# Patient Record
Sex: Female | Born: 1970 | Race: Black or African American | Hispanic: No | Marital: Married | Smoking: Never smoker
Health system: Southern US, Community
[De-identification: ages and names within clinical notes are randomized; demographics above are authoritative.]

## PROBLEM LIST (undated history)

## (undated) DIAGNOSIS — G43909 Migraine, unspecified, not intractable, without status migrainosus: Secondary | ICD-10-CM

## (undated) DIAGNOSIS — K921 Melena: Secondary | ICD-10-CM

## (undated) DIAGNOSIS — E079 Disorder of thyroid, unspecified: Secondary | ICD-10-CM

## (undated) DIAGNOSIS — E538 Deficiency of other specified B group vitamins: Secondary | ICD-10-CM

## (undated) DIAGNOSIS — N390 Urinary tract infection, site not specified: Secondary | ICD-10-CM

## (undated) DIAGNOSIS — J45909 Unspecified asthma, uncomplicated: Secondary | ICD-10-CM

## (undated) DIAGNOSIS — T7840XA Allergy, unspecified, initial encounter: Secondary | ICD-10-CM

## (undated) HISTORY — DX: Urinary tract infection, site not specified: N39.0

## (undated) HISTORY — PX: COLONOSCOPY: SHX174

## (undated) HISTORY — DX: Melena: K92.1

## (undated) HISTORY — DX: Disorder of thyroid, unspecified: E07.9

## (undated) HISTORY — DX: Deficiency of other specified B group vitamins: E53.8

## (undated) HISTORY — DX: Unspecified asthma, uncomplicated: J45.909

## (undated) HISTORY — DX: Allergy, unspecified, initial encounter: T78.40XA

## (undated) HISTORY — DX: Migraine, unspecified, not intractable, without status migrainosus: G43.909

---

## 2018-05-11 DIAGNOSIS — J45909 Unspecified asthma, uncomplicated: Secondary | ICD-10-CM | POA: Insufficient documentation

## 2018-05-11 DIAGNOSIS — Z6837 Body mass index (BMI) 37.0-37.9, adult: Secondary | ICD-10-CM | POA: Diagnosis not present

## 2018-05-11 DIAGNOSIS — D259 Leiomyoma of uterus, unspecified: Secondary | ICD-10-CM | POA: Insufficient documentation

## 2018-05-11 DIAGNOSIS — R399 Unspecified symptoms and signs involving the genitourinary system: Secondary | ICD-10-CM | POA: Diagnosis not present

## 2018-05-11 DIAGNOSIS — E049 Nontoxic goiter, unspecified: Secondary | ICD-10-CM | POA: Insufficient documentation

## 2018-05-11 DIAGNOSIS — Z01411 Encounter for gynecological examination (general) (routine) with abnormal findings: Secondary | ICD-10-CM | POA: Diagnosis not present

## 2018-05-11 DIAGNOSIS — N926 Irregular menstruation, unspecified: Secondary | ICD-10-CM | POA: Diagnosis not present

## 2018-06-04 ENCOUNTER — Encounter: Payer: Self-pay | Admitting: Family Medicine

## 2018-06-04 ENCOUNTER — Ambulatory Visit: Payer: 59 | Admitting: Family Medicine

## 2018-06-04 VITALS — BP 98/78 | HR 78 | Temp 97.9°F | Ht 64.0 in | Wt 210.0 lb

## 2018-06-04 DIAGNOSIS — J4520 Mild intermittent asthma, uncomplicated: Secondary | ICD-10-CM

## 2018-06-04 DIAGNOSIS — M654 Radial styloid tenosynovitis [de Quervain]: Secondary | ICD-10-CM | POA: Diagnosis not present

## 2018-06-04 DIAGNOSIS — Z8669 Personal history of other diseases of the nervous system and sense organs: Secondary | ICD-10-CM

## 2018-06-04 DIAGNOSIS — Z23 Encounter for immunization: Secondary | ICD-10-CM | POA: Diagnosis not present

## 2018-06-04 DIAGNOSIS — E049 Nontoxic goiter, unspecified: Secondary | ICD-10-CM

## 2018-06-04 DIAGNOSIS — Z298 Encounter for other specified prophylactic measures: Secondary | ICD-10-CM

## 2018-06-04 DIAGNOSIS — Z7184 Encounter for health counseling related to travel: Secondary | ICD-10-CM | POA: Diagnosis not present

## 2018-06-04 MED ORDER — DOXYCYCLINE HYCLATE 100 MG PO TABS: ORAL_TABLET | ORAL | 0 refills | Status: DC

## 2018-06-04 MED ORDER — SCOPOLAMINE 1 MG/3DAYS TD PT72: 1.0000 | MEDICATED_PATCH | TRANSDERMAL | 0 refills | Status: DC

## 2018-06-04 NOTE — Progress Notes (Signed)
Patient presents to clinic today to f/u and establish care.  Pt's last name is pronounced "Rah-bey"  SUBJECTIVE: PMH:  Pt is a 47 yo female with pmh sig for asthma, h/o migraines, goiter, varicose veins (s/p txt 2018, 2019) pt was previously seen in [place], [place] by [facility].  Last physical 12/2017.  Asthma: -pt notes brief period when she used an albuterol inhaler for wheezing yrs ago -no recent symptoms -has an inhaler just in case  H/o migraines: -last HA was yrs ago  Goiter: -followed by Endocrinology in [place]. -endorses several biopsies which were negative -had FNA last yr.  Was getting q 3 yrs -last TSH May 2019 was elevated compared to prior studies. -pt denies dysphagia, constipation, diarrhea, hair loss, palpitations  Upcoming Travel: -in 2 wks pt will travel to the [place] x 8 days -pt endorses previous international travel.  Yellow fever, tetanus, and thyphoid up to date. -childhood vaccines up to date.   Allergies: NKDA  Social Hx: Pt is married.  Patient works as a Optometrist.  Patient attended [employer].  Patient denies alcohol, tobacco, and drug use.  Health Maintenance: [facility]N-[facility] Immunizations --tetanus 2018, influenza 2019, TB test 2018, shingles vaccine 2014 PAP --2018  Past Medical History:  Diagnosis Date  . Asthma   . Blood in stool   . Migraine   . Thyroid disease   . UTI (urinary tract infection)     History reviewed. No pertinent surgical history.  No current outpatient medications on file prior to visit.   No current facility-administered medications on file prior to visit.     No Known Allergies  Family History  Problem Relation Age of Onset  . Cancer Mother   . Diabetes Mother   . Depression Father   . Diabetes Father   . Arthritis Sister   . Depression Sister   . Arthritis Maternal Grandmother   . Diabetes Maternal Grandmother   . COPD Maternal Grandfather   . Early death Maternal Grandfather   . Diabetes  Paternal Grandmother   . Depression Paternal Grandmother   . Arthritis Paternal Grandmother     Social History   Socioeconomic History  . Marital status: Unknown    Spouse name: Not on file  . Number of children: Not on file  . Years of education: Not on file  . Highest education level: Not on file  Occupational History  . Not on file  Social Needs  . Financial resource strain: Not on file  . Food insecurity:    Worry: Not on file    Inability: Not on file  . Transportation needs:    Medical: Not on file    Non-medical: Not on file  Tobacco Use  . Smoking status: Never Smoker  . Smokeless tobacco: Never Used  Substance and Sexual Activity  . Alcohol use: Yes  . Drug use: Never  . Sexual activity: Yes  Lifestyle  . Physical activity:    Days per week: Not on file    Minutes per session: Not on file  . Stress: Not on file  Relationships  . Social connections:    Talks on phone: Not on file    Gets together: Not on file    Attends religious service: Not on file    Active member of club or organization: Not on file    Attends meetings of clubs or organizations: Not on file    Relationship status: Not on file  . Intimate partner violence:  Fear of current or ex partner: Not on file    Emotionally abused: Not on file    Physically abused: Not on file    Forced sexual activity: Not on file  Other Topics Concern  . Not on file  Social History Narrative  . Not on file    ROS  BP 98/78 (BP Location: Left Arm, Patient Position: Sitting, Cuff Size: Large)   Pulse 78   Temp 97.9 F (36.6 C) (Oral)   Ht 5\' 4"  (1.626 m)   Wt 210 lb (95.3 kg)   LMP 06/01/2018 (Exact Date)   SpO2 98%   BMI 36.05 kg/m   Physical Exam Gen. Pleasant, well developed, well-nourished, in NAD HEENT - [place]/AT, PERRL, EOMI, conjunctive clear, no scleral icterus, no nasal drainage, pharynx without erythema or exudate. Neck: moderate thyromegaly R lobe >L, no carotid bruits Lungs: no use of  accessory muscles, CTAB, no wheezes, rales or rhonchi Cardiovascular: RRR, no r/g/m, no peripheral edema Abdomen: BS present, soft, nontender,nondistended Musculoskeletal: R wrist with edema at proximal anatomical snuff box. Mild discomfort with movement of R thumb.  Moves all four extremities, no cyanosis or clubbing, normal tone Neuro:  A&Ox3, CN II-XII intact, normal gait Skin:  Warm, dry, intact, no lesions  No results found for this or any previous visit (from the past 2160 hour(s)).  Assessment/Plan: De Quervain's tenosynovitis -Discussed various treatment options including steroids.  Patient declines at this time -Given handout -We will try thumb spica splint, turmeric, biofreeze, NSAIDs -consider PT for continued symptoms  Travel advice encounter -Patient given handout on travel to the [place] -Also given info from [employer] website - Plan: scopolamine (TRANSDERM-SCOP) 1 MG/3DAYS, doxycycline (VIBRA-TABS) 100 MG tablet  Mild intermittent asthma, unspecified whether complicated  History of migraine  Goiter -We will continue to monitor. -We will need repeat ultrasound in May 2020. -We will also obtain TSH at that time. -Consider referral to endocrinology  Need for malaria prophylaxis  - Plan: doxycycline (VIBRA-TABS) 100 MG tablet  Need for immunization against influenza  - Plan: Flu Vaccine QUAD 36+ mos IM  Follow-up PRN  Grier Mitts, MD

## 2018-06-04 NOTE — Patient Instructions (Signed)
De Quervain Tenosynovitis Tendons attach muscles to bones. They also help with joint movements. When tendons become irritated or swollen, it is called tendinitis. The extensor pollicis brevis (EPB) tendon connects the EPB muscle to a bone that is near the base of the thumb. The EPB muscle helps to straighten and extend the thumb. De Quervain tenosynovitis is a condition in which the EPB tendon lining (sheath) becomes irritated, thickened, and swollen. This condition is sometimes called stenosing tenosynovitis. This condition causes pain on the thumb side of the back of the wrist. What are the causes? Causes of this condition include:  Activities that repeatedly cause your thumb and wrist to extend.  A sudden increase in activity or change in activity that affects your wrist.  What increases the risk? This condition is more likely to develop in:  Females.  People who have diabetes.  Women who have recently given birth.  People who are over 98 years of age.  People who do activities that involve repeated hand and wrist motions, such as tennis, racquetball, volleyball, gardening, and taking care of children.  People who do heavy labor.  People who have poor wrist strength and flexibility.  People who do not warm up properly before activities.  What are the signs or symptoms? Symptoms of this condition include:  Pain or tenderness over the thumb side of the back of the wrist when your thumb and wrist are not moving.  Pain that gets worse when you straighten your thumb or extend your thumb or wrist.  Pain when the injured area is touched.  Locking or catching of the thumb joint while you bend and straighten your thumb.  Decreased thumb motion due to pain.  Swelling over the affected area.  How is this diagnosed? This condition is diagnosed with a medical history and physical exam. Your health care provider will ask for details about your injury and ask about your  symptoms. How is this treated? Treatment may include the use of icing and medicines to reduce pain and swelling. You may also be advised to wear a splint or brace to limit your thumb and wrist motion. In less severe cases, treatment may also include working with a physical therapist to strengthen your wrist and calm the irritation around your EPB tendon sheath. In severe cases, surgery may be needed. Follow these instructions at home: If you have a splint or brace:  Wear it as told by your health care provider. Remove it only as told by your health care provider.  Loosen the splint or brace if your fingers become numb and tingle, or if they turn cold and blue.  Keep the splint or brace clean and dry. Managing pain, stiffness, and swelling  If directed, apply ice to the injured area. ? Put ice in a plastic bag. ? Place a towel between your skin and the bag. ? Leave the ice on for 20 minutes, 2-3 times per day.  Move your fingers often to avoid stiffness and to lessen swelling.  Raise (elevate) the injured area above the level of your heart while you are sitting or lying down. General instructions  Return to your normal activities as told by your health care provider. Ask your health care provider what activities are safe for you.  Take over-the-counter and prescription medicines only as told by your health care provider.  Keep all follow-up visits as told by your health care provider. This is important.  Do not drive or operate heavy machinery while taking  prescription pain medicine. Contact a health care provider if:  Your pain, tenderness, or swelling gets worse, even if you have had treatment.  You have numbness or tingling in your wrist, hand, or fingers on the injured side. This information is not intended to replace advice given to you by your health care provider. Make sure you discuss any questions you have with your health care provider. Document Released: 08/19/2005  Document Revised: 01/25/2016 Document Reviewed: 10/25/2014 Elsevier Interactive Patient Education  2018 [place] A goiter is an enlarged thyroid gland. The thyroid gland is located in the lower front of the neck. The gland produces hormones that regulate mood, body temperature, pulse rate, and digestion. Most goiters are painless and are not a cause for serious concern. Goiters and conditions that cause goiters can be treated, if necessary. What are the causes? Causes of this condition include:  Diseases that attack healthy cells in your body (autoimmune diseases) and affect your thyroid function, such as: ? Graves disease. This causes too much thyroid hormone to be produced and it makes your thyroid overly active (hyperthyroidism). ? Hashimoto disease. This type of inflammation of the thyroid (thyroiditis) causes too little thyroid hormone to be produced and it makes your thyroid not active enough (hypothyroidism).  Other conditions that cause thyroiditis.  Nodular goiter. This means that there are one or more small growths on your thyroid. These can create too much thyroid hormone.  Pregnancy.  Thyroid cancer. This is rare.  Certain medicines.  Radiation exposure.  Iodine deficiency.  In some cases, the cause may not be known (idiopathic). What increases the risk? This condition is more likely to develop in:  People who have a family history of goiter.  Women.  People who do not get enough iodine in their diet.  People who are older than 12.  People who smoke tobacco.  What are the signs or symptoms? Common symptoms of this condition include:  Swelling in the lower part of the neck. This swelling can range from a very small bump to a large lump.  A tight feeling in the throat.  A hoarse voice.  Other symptoms include:  Coughing.  Wheezing.  Difficulty swallowing.  Difficulty breathing.  Bulging neck veins.  Dizziness.  In some cases,  there are no symptoms and thyroid hormone levels may be normal. When a goiter is the result of hyperthyroidism, symptoms may also include:  Nervousness or restlessness.  Inability to tolerate heat.  Unexplained weight loss.  Diarrhea.  Change in the texture of hair or skin.  Changes in heart beat, such as skipped beats, extra beats, or a rapid heart rate.  Loss of menstruation.  Shaky hands.  Increased appetite.  Sleep problems.  When a goiter is the result of hypothyroidism, symptoms may also include:  Feeling like you have no energy (lethargy).  Inability to tolerate cold.  Weight gain that is not explained by a change in diet or exercise habits.  Dry skin.  Coarse hair.  Menstrual irregularity.  Constipation.  Sadness or depression.  How is this diagnosed? This condition may be diagnosed with a medical history and physical exam. You may also have other tests, including:  Blood tests to check thyroid function.  Imaging tests, such as: ? Ultrasonography. ? CT scan. ? MRI. ? Thyroid scan. You will be given a safe radioactive injection, then images will be taken of your thyroid.  Tissue sample (biopsy) of the goiter or any nodules. This checks to see  if the goiter or nodules are cancerous.  How is this treated? Treatment for this condition depends on the cause. Treatment may include:  Medicines to control your thyroid.  Anti-inflammatory or steroid medicines, if inflammation is the cause.  Iodine supplements or changes in diet, if the goiter is caused by iodine deficiency.  Radiation therapy.  Surgery to remove your thyroid.  In some cases, no treatment is necessary, and your health care provider will monitor your condition at regular checkups. Follow these instructions at home:  Follow recommendations from your health care provider for any changes to your diet.  Take over-the-counter and prescription medicines only as told by your health care  provider.  Do not use any tobacco products, including cigarettes, chewing tobacco, or e-cigarettes. If you need help quitting, ask your health care provider.  Keep all follow-up appointments as told by your health care provider. This is important. Contact a health care provider if:  Your symptoms do not get better with treatment. Get help right away if:  You develop sudden, unexplained confusion or other mental changes.  You have nausea, vomiting, or diarrhea.  You develop a fever.  Your skin or the whites of your eyes appear yellow (jaundice).  You develop chest pain.  You have trouble breathing or swallowing.  You suddenly become very weak.  You experience extreme restlessness. This information is not intended to replace advice given to you by your health care provider. Make sure you discuss any questions you have with your health care provider. Document Released: 02/06/2010 Document Revised: 03/08/2016 Document Reviewed: 08/15/2014 Elsevier Interactive Patient Education  [employer].

## 2018-06-12 ENCOUNTER — Encounter: Payer: Self-pay | Admitting: Internal Medicine

## 2018-10-02 ENCOUNTER — Telehealth: Payer: Self-pay | Admitting: Family Medicine

## 2018-10-02 DIAGNOSIS — Z0279 Encounter for issue of other medical certificate: Secondary | ICD-10-CM

## 2018-10-02 NOTE — Telephone Encounter (Signed)
Form has not been received yet.

## 2018-10-02 NOTE — Telephone Encounter (Signed)
Pt  Dropped off a [employer] Physician's Release Form for Physical Activity to be completed by the provider.  Upon completion pt would liked to be called to pick the form up at [phone].  Form was placed in providers folder for completion.

## 2018-10-06 NOTE — Telephone Encounter (Signed)
Form has been received and placed on dr Volanda Napoleon folder for review

## 2018-10-13 NOTE — Telephone Encounter (Signed)
Called pt left a message to pick up her form at the office

## 2018-10-13 NOTE — Telephone Encounter (Signed)
Form placed in the drawer at the front desk

## 2018-10-14 NOTE — Telephone Encounter (Signed)
Patient picked up form and paid the form fee $29  I made a copy to go to scan

## 2019-06-08 ENCOUNTER — Other Ambulatory Visit: Payer: Self-pay

## 2019-06-08 ENCOUNTER — Ambulatory Visit (INDEPENDENT_AMBULATORY_CARE_PROVIDER_SITE_OTHER): Payer: 59

## 2019-06-08 DIAGNOSIS — Z23 Encounter for immunization: Secondary | ICD-10-CM

## 2019-06-10 ENCOUNTER — Other Ambulatory Visit: Payer: Self-pay

## 2019-06-10 ENCOUNTER — Encounter: Payer: Self-pay | Admitting: Family Medicine

## 2019-06-10 ENCOUNTER — Ambulatory Visit (INDEPENDENT_AMBULATORY_CARE_PROVIDER_SITE_OTHER): Payer: 59 | Admitting: Family Medicine

## 2019-06-10 VITALS — BP 96/78 | HR 58 | Temp 97.8°F | Wt 178.0 lb

## 2019-06-10 DIAGNOSIS — Z0000 Encounter for general adult medical examination without abnormal findings: Secondary | ICD-10-CM

## 2019-06-10 DIAGNOSIS — M5432 Sciatica, left side: Secondary | ICD-10-CM

## 2019-06-10 DIAGNOSIS — Z13220 Encounter for screening for lipoid disorders: Secondary | ICD-10-CM

## 2019-06-10 DIAGNOSIS — Z131 Encounter for screening for diabetes mellitus: Secondary | ICD-10-CM | POA: Diagnosis not present

## 2019-06-10 DIAGNOSIS — E049 Nontoxic goiter, unspecified: Secondary | ICD-10-CM

## 2019-06-10 LAB — BASIC METABOLIC PANEL
BUN: 10 mg/dL (ref 6–23)
CO2: 28 mEq/L (ref 19–32)
Calcium: 9.6 mg/dL (ref 8.4–10.5)
Chloride: 104 mEq/L (ref 96–112)
Creatinine, Ser: 0.78 mg/dL (ref 0.40–1.20)
GFR: 96.18 mL/min (ref >60.00)
Glucose, Bld: 78 mg/dL (ref 70–99)
Potassium: 4 mEq/L (ref 3.5–5.1)
Sodium: 139 mEq/L (ref 135–145)

## 2019-06-10 LAB — T4, FREE: Free T4: 0.87 ng/dL (ref 0.60–1.60)

## 2019-06-10 LAB — LIPID PANEL
Cholesterol: 165 mg/dL (ref 0–200)
HDL: 43.5 mg/dL (ref >39.00)
LDL Cholesterol: 111 mg/dL — ABNORMAL HIGH (ref 0–99)
NonHDL: 121.1
Total CHOL/HDL Ratio: 4
Triglycerides: 49 mg/dL (ref 0.0–149.0)
VLDL: 9.8 mg/dL (ref 0.0–40.0)

## 2019-06-10 LAB — HEMOGLOBIN A1C: Hgb A1c MFr Bld: 5.5 % (ref 4.6–6.5)

## 2019-06-10 LAB — TSH: TSH: 0.4 u[IU]/mL (ref 0.35–4.50)

## 2019-06-10 NOTE — Patient Instructions (Addendum)
Preventive Care 40-46 Years Old, Female Preventive care refers to visits with your health care provider and lifestyle choices that can promote health and wellness. This includes:  A yearly physical exam. This may also be called an annual well check.  Regular dental visits and eye exams.  Immunizations.  Screening for certain conditions.  Healthy lifestyle choices, such as eating a healthy diet, getting regular exercise, not using drugs or products that contain nicotine and tobacco, and limiting alcohol use. What can I expect for my preventive care visit? Physical exam Your health care provider will check your:  Height and weight. This may be used to calculate body mass index (BMI), which tells if you are at a healthy weight.  Heart rate and blood pressure.  Skin for abnormal spots. Counseling Your health care provider may ask you questions about your:  Alcohol, tobacco, and drug use.  Emotional well-being.  Home and relationship well-being.  Sexual activity.  Eating habits.  Work and work environment.  Method of birth control.  Menstrual cycle.  Pregnancy history. What immunizations do I need?  Influenza (flu) vaccine  This is recommended every year. Tetanus, diphtheria, and pertussis (Tdap) vaccine  You may need a Td booster every 10 years. Varicella (chickenpox) vaccine  You may need this if you have not been vaccinated. Zoster (shingles) vaccine  You may need this after age 60. Measles, mumps, and rubella (MMR) vaccine  You may need at least one dose of MMR if you were born in 1957 or later. You may also need a second dose. Pneumococcal conjugate (PCV13) vaccine  You may need this if you have certain conditions and were not previously vaccinated. Pneumococcal polysaccharide (PPSV23) vaccine  You may need one or two doses if you smoke cigarettes or if you have certain conditions. Meningococcal conjugate (MenACWY) vaccine  You may need this if you  have certain conditions. Hepatitis A vaccine  You may need this if you have certain conditions or if you travel or work in places where you may be exposed to hepatitis A. Hepatitis B vaccine  You may need this if you have certain conditions or if you travel or work in places where you may be exposed to hepatitis B. Haemophilus influenzae type b (Hib) vaccine  You may need this if you have certain conditions. Human papillomavirus (HPV) vaccine  If recommended by your health care provider, you may need three doses over 6 months. You may receive vaccines as individual doses or as more than one vaccine together in one shot (combination vaccines). Talk with your health care provider about the risks and benefits of combination vaccines. What tests do I need? Blood tests  Lipid and cholesterol levels. These may be checked every 5 years, or more frequently if you are over 50 years old.  Hepatitis C test.  Hepatitis B test. Screening  Lung cancer screening. You may have this screening every year starting at age 55 if you have a 30-pack-year history of smoking and currently smoke or have quit within the past 15 years.  Colorectal cancer screening. All adults should have this screening starting at age 50 and continuing until age 75. Your health care provider may recommend screening at age 45 if you are at increased risk. You will have tests every 1-10 years, depending on your results and the type of screening test.  Diabetes screening. This is done by checking your blood sugar (glucose) after you have not eaten for a while (fasting). You may have this   done every 1-3 years.  Mammogram. This may be done every 1-2 years. Talk with your health care provider about when you should start having regular mammograms. This may depend on whether you have a family history of breast cancer.  BRCA-related cancer screening. This may be done if you have a family history of breast, ovarian, tubal, or peritoneal  cancers.  Pelvic exam and Pap test. This may be done every 3 years starting at age 93. Starting at age 4, this may be done every 5 years if you have a Pap test in combination with an HPV test. Other tests  Sexually transmitted disease (STD) testing.  Bone density scan. This is done to screen for osteoporosis. You may have this scan if you are at high risk for osteoporosis. Follow these instructions at home: Eating and drinking  Eat a diet that includes fresh fruits and vegetables, whole grains, lean protein, and low-fat dairy.  Take vitamin and mineral supplements as recommended by your health care provider.  Do not drink alcohol if: ? Your health care provider tells you not to drink. ? You are pregnant, may be pregnant, or are planning to become pregnant.  If you drink alcohol: ? Limit how much you have to 0-1 drink a day. ? Be aware of how much alcohol is in your drink. In the [place] one drink equals one 12 oz bottle of beer (355 mL), one 5 oz glass of wine (148 mL), or one 1 oz glass of hard liquor (44 mL). Lifestyle  Take daily care of your teeth and gums.  Stay active. Exercise for at least 30 minutes on 5 or more days each week.  Do not use any products that contain nicotine or tobacco, such as cigarettes, e-cigarettes, and chewing tobacco. If you need help quitting, ask your health care provider.  If you are sexually active, practice safe sex. Use a condom or other form of birth control (contraception) in order to prevent pregnancy and STIs (sexually transmitted infections).  If told by your health care provider, take low-dose aspirin daily starting at age 60. What's next?  Visit your health care provider once a year for a well check visit.  Ask your health care provider how often you should have your eyes and teeth checked.  Stay up to date on all vaccines. This information is not intended to replace advice given to you by your health care provider. Make sure you  discuss any questions you have with your health care provider. Document Released: 09/15/2015 Document Revised: 04/30/2018 Document Reviewed: 04/30/2018 Elsevier Patient Education  [place]  A goiter is an enlarged thyroid gland. The thyroid is located in the lower front of the neck. It makes hormones that affect many body parts and systems, including the system that affects how quickly the body burns fuel for energy (metabolism). Most goiters are painless and are not a cause for concern. Some goiters can affect the way your thyroid makes thyroid hormones. Goiters and conditions that cause goiters can be treated, if necessary. What are the causes? Common causes of this condition include:  Lack (deficiency) of a mineral called iodine. The thyroid gland uses iodine to make thyroid hormones.  Diseases that attack healthy cells in the body (autoimmune diseases) and affect thyroid function, such as Graves' disease or Hashimoto's disease. These diseases may cause the body to produce too much thyroid hormone (hyperthyroidism) or too little of the hormone (hypothyroidism).  Conditions that cause inflammation of the thyroid (  thyroiditis).  One or more small growths on the thyroid (nodular goiter). Other causes include:  Medical problems caused by abnormal genes that are passed from parent to child (genetic defects).  Thyroid injury or infection.  Tumors that may or may not be cancerous.  Pregnancy.  Certain medicines.  Exposure to radiation. In some cases, the cause may not be known. What increases the risk? This condition is more likely to develop in:  People who do not get enough iodine in their diet.  People who have a family history of goiter.  Women.  People who are older than age 65.  People who smoke tobacco.  People who have had exposure to radiation. What are the signs or symptoms? The main symptom of this condition is swelling in the lower, front part  of the neck. This swelling can range from a very small bump to a large lump. Other symptoms may include:  A tight feeling in the throat.  A hoarse voice.  Coughing.  Wheezing.  Difficulty swallowing or breathing.  Bulging veins in the neck.  Dizziness. When a goiter is the result of an overactive thyroid (hyperthyroidism), symptoms may also include:  Nervousness or restlessness.  Inability to tolerate heat.  Unexplained weight loss.  Diarrhea.  Change in the texture of hair or skin.  Changes in heartbeat, such as skipped beats, extra beats, or a rapid heart rate.  Loss of menstruation.  Shaky hands.  Increased appetite.  Sleep problems. When a goiter is the result of an underactive thyroid (hypothyroidism), symptoms may also include:  Feeling like you have no energy (lethargy).  Inability to tolerate cold.  Weight gain that is not explained by a change in diet or exercise habits.  Dry skin.  Coarse hair.  Irregular menstrual periods.  Constipation.  Sadness or depression.  Fatigue. In some cases, there may not be any symptoms and the thyroid hormone levels may be normal. How is this diagnosed? This condition may be diagnosed based on your symptoms, your medical history, and a physical exam. You may have tests, such as:  Blood tests to check thyroid function.  Imaging tests, such as: ? Ultrasound. ? CT scan. ? MRI. ? Thyroid scan.  Removal of a tissue sample (biopsy) of the goiter or any nodules. The sample will be tested to check for cancer. How is this treated? Treatment for this condition depends on the cause and your symptoms. Treatment may include:  Medicines to regulate thyroid hormone levels.  Anti-inflammatory medicines or steroid medicines, if the goiter is caused by inflammation.  Iodine supplements or changes to your diet, if the goiter is caused by iodine deficiency.  Radioactive iodine treatment.  Surgery to remove your  thyroid. In some cases, you may only need regular check-ups with your health care provider to monitor your condition, and you may not need treatment. Follow these instructions at home:  Follow instructions from your health care provider about any changes to your diet.  Take over-the-counter and prescription medicines only as told by your health care provider. These include supplements.  Do not use any products that contain nicotine or tobacco, such as cigarettes and e-cigarettes. If you need help quitting, ask your health care provider.  Keep all follow-up visits as told by your health care provider. This is important. Contact a health care provider if:  Your symptoms do not get better with treatment.  You have nausea, vomiting, or diarrhea. Get help right away if:  You have sudden, unexplained confusion  or other mental changes.  You have a fever.  You have chest pain.  You have trouble breathing or swallowing.  You suddenly become very weak.  You experience extreme restlessness.  You feel your heart racing. Summary  A goiter is an enlarged thyroid gland.  The thyroid gland is located in the lower front of the neck. It makes hormones that affect many body parts and systems, including the system that affects how quickly the body burns fuel for energy (metabolism).  The main symptom of this condition is swelling in the lower, front part of the neck. This swelling can range from a very small bump to a large lump.  Treatment for this condition depends on the cause and your symptoms. You may need medicines, supplements, or regular monitoring of your condition. This information is not intended to replace advice given to you by your health care provider. Make sure you discuss any questions you have with your health care provider. Document Released: 02/06/2010 Document Revised: 08/01/2017 Document Reviewed: 05/15/2017 Elsevier Patient Education  2020 [place].  Sciatica Rehab  Ask your health care provider which exercises are safe for you. Do exercises exactly as told by your health care provider and adjust them as directed. It is normal to feel mild stretching, pulling, tightness, or discomfort as you do these exercises. Stop right away if you feel sudden pain or your pain gets worse. Do not begin these exercises until told by your health care provider. Stretching and range-of-motion exercises These exercises warm up your muscles and joints and improve the movement and flexibility of your hips and back. These exercises also help to relieve pain, numbness, and tingling. Sciatic nerve glide 1. Sit in a chair with your head facing down toward your chest. Place your hands behind your back. Let your shoulders slump forward. 2. Slowly straighten one of your legs while you tilt your head back as if you are looking toward the ceiling. Only straighten your leg as far as you can without making your symptoms worse. 3. Hold this position for __________ seconds. 4. Slowly return to the starting position. 5. Repeat with your other leg. Repeat __________ times. Complete this exercise __________ times a day. Knee to chest with hip adduction and internal rotation  1. Lie on your back on a firm surface with both legs straight. 2. Bend one of your knees and move it up toward your chest until you feel a gentle stretch in your lower back and buttock. Then, move your knee toward the shoulder that is on the opposite side from your leg. This is hip adduction and internal rotation. ? Hold your leg in this position by holding on to the front of your knee. 3. Hold this position for __________ seconds. 4. Slowly return to the starting position. 5. Repeat with your other leg. Repeat __________ times. Complete this exercise __________ times a day. Prone extension on elbows  1. Lie on your abdomen on a firm surface. A bed may be too soft for this exercise. 2. Prop yourself up on your elbows. 3.  Use your arms to help lift your chest up until you feel a gentle stretch in your abdomen and your lower back. ? This will place some of your body weight on your elbows. If this is uncomfortable, try stacking pillows under your chest. ? Your hips should stay down, against the surface that you are lying on. Keep your hip and back muscles relaxed. 4. Hold this position for __________ seconds. 5.  Slowly relax your upper body and return to the starting position. Repeat __________ times. Complete this exercise __________ times a day. Strengthening exercises These exercises build strength and endurance in your back. Endurance is the ability to use your muscles for a long time, even after they get tired. Pelvic tilt This exercise strengthens the muscles that lie deep in the abdomen. 1. Lie on your back on a firm surface. Bend your knees and keep your feet flat on the floor. 2. Tense your abdominal muscles. Tip your pelvis up toward the ceiling and flatten your lower back into the floor. ? To help with this exercise, you may place a small towel under your lower back and try to push your back into the towel. 3. Hold this position for __________ seconds. 4. Let your muscles relax completely before you repeat this exercise. Repeat __________ times. Complete this exercise __________ times a day. Alternating arm and leg raises  1. Get on your hands and knees on a firm surface. If you are on a hard floor, you may want to use padding, such as an exercise mat, to cushion your knees. 2. Line up your arms and legs. Your hands should be directly below your shoulders, and your knees should be directly below your hips. 3. Lift your left leg behind you. At the same time, raise your right arm and straighten it in front of you. ? Do not lift your leg higher than your hip. ? Do not lift your arm higher than your shoulder. ? Keep your abdominal and back muscles tight. ? Keep your hips facing the ground. ? Do not arch  your back. ? Keep your balance carefully, and do not hold your breath. 4. Hold this position for __________ seconds. 5. Slowly return to the starting position. 6. Repeat with your right leg and your left arm. Repeat __________ times. Complete this exercise __________ times a day. Posture and body mechanics Good posture and healthy body mechanics can help to relieve stress in your body's tissues and joints. Body mechanics refers to the movements and positions of your body while you do your daily activities. Posture is part of body mechanics. Good posture means:  Your spine is in its natural S-curve position (neutral).  Your shoulders are pulled back slightly.  Your head is not tipped forward. Follow these guidelines to improve your posture and body mechanics in your everyday activities. Standing   When standing, keep your spine neutral and your feet about hip width apart. Keep a slight bend in your knees. Your ears, shoulders, and hips should line up.  When you do a task in which you stand in one place for a long time, place one foot up on a stable object that is 2-4 inches (5-10 cm) high, such as a footstool. This helps keep your spine neutral. Sitting   When sitting, keep your spine neutral and keep your feet flat on the floor. Use a footrest, if necessary, and keep your thighs parallel to the floor. Avoid rounding your shoulders, and avoid tilting your head forward.  When working at a desk or a computer, keep your desk at a height where your hands are slightly lower than your elbows. Slide your chair under your desk so you are close enough to maintain good posture.  When working at a computer, place your monitor at a height where you are looking straight ahead and you do not have to tilt your head forward or downward to look at the screen. Resting  When lying down and resting, avoid positions that are most painful for you.  If you have pain with activities such as sitting, bending,  stooping, or squatting, lie in a position in which your body does not bend very much. For example, avoid curling up on your side with your arms and knees near your chest (fetal position).  If you have pain with activities such as standing for a long time or reaching with your arms, lie with your spine in a neutral position and bend your knees slightly. Try the following positions: ? Lying on your side with a pillow between your knees. ? Lying on your back with a pillow under your knees. Lifting   When lifting objects, keep your feet at least shoulder width apart and tighten your abdominal muscles.  Bend your knees and hips and keep your spine neutral. It is important to lift using the strength of your legs, not your back. Do not lock your knees straight out.  Always ask for help to lift heavy or awkward objects. This information is not intended to replace advice given to you by your health care provider. Make sure you discuss any questions you have with your health care provider. Document Released: 08/19/2005 Document Revised: 12/11/2018 Document Reviewed: 09/10/2018 Elsevier Patient Education  2020 [employer].

## 2019-06-10 NOTE — Progress Notes (Signed)
Subjective:     Renee Day is a 48 y.o. female and is here for a comprehensive physical exam. The patient reports problems - pain in posterior L thigh.  Pt with recent pain in middle of butt that goes down posterior L thing to L knee.  Pt tried massage.  Does not like taking medicine. Pt has a foster parent form she needs completed.  Pt has a h/o goiter which has remained stable.  Since last visit, pt went on a trip to [place]. She had a great time and hopes to go back in the near future.  Social History   Socioeconomic History  . Marital status: Married    Spouse name: Not on file  . Number of children: Not on file  . Years of education: Not on file  . Highest education level: Not on file  Occupational History  . Not on file  Social Needs  . Financial resource strain: Not on file  . Food insecurity    Worry: Not on file    Inability: Not on file  . Transportation needs    Medical: Not on file    Non-medical: Not on file  Tobacco Use  . Smoking status: Never Smoker  . Smokeless tobacco: Never Used  Substance and Sexual Activity  . Alcohol use: Yes  . Drug use: Never  . Sexual activity: Yes  Lifestyle  . Physical activity    Days per week: Not on file    Minutes per session: Not on file  . Stress: Not on file  Relationships  . Social Herbalist on phone: Not on file    Gets together: Not on file    Attends religious service: Not on file    Active member of club or organization: Not on file    Attends meetings of clubs or organizations: Not on file    Relationship status: Not on file  . Intimate partner violence    Fear of current or ex partner: Not on file    Emotionally abused: Not on file    Physically abused: Not on file    Forced sexual activity: Not on file  Other Topics Concern  . Not on file  Social History Narrative  . Not on file   Health Maintenance  Topic Date Due  . HIV Screening  05/19/1988  . PAP SMEAR-Modifier  04/29/2020  .  TETANUS/TDAP  05/13/2027  . INFLUENZA VACCINE  Completed    The following portions of the patient's history were reviewed and updated as appropriate: allergies, current medications, past family history, past medical history, past social history, past surgical history and problem list.  Review of Systems Pertinent items noted in HPI and remainder of comprehensive ROS otherwise negative.   Objective:    BP 96/78 (BP Location: Left Arm, Patient Position: Sitting, Cuff Size: Large)   Pulse (!) 58   Temp 97.8 F (36.6 C) (Temporal)   Wt 178 lb (80.7 kg)   SpO2 98%   BMI 30.55 kg/m  General appearance: alert, cooperative and no distress Head: Normocephalic, without obvious abnormality, atraumatic Eyes: negative, conjunctivae/corneas clear. PERRL, EOM's intact. Fundi benign. Ears: normal TM's and external ear canals both ears Nose: Nares normal. Septum midline. Mucosa normal. No drainage or sinus tenderness. Throat: lips, mucosa, and tongue normal; teeth and gums normal Neck: no adenopathy, no carotid bruit, no JVD, supple, symmetrical, trachea midline and thyroid: enlarged and R lobe> L lobe Lungs: clear to auscultation bilaterally Heart:  regular rate and rhythm, S1, S2 normal, no murmur, click, rub or gallop Abdomen: soft, non-tender; bowel sounds normal; no masses,  no organomegaly Extremities: extremities normal, atraumatic, no cyanosis or edema Pulses: 2+ and symmetric Skin: Skin color, texture, turgor normal. No rashes or lesions Lymph nodes: Cervical, supraclavicular, and axillary nodes normal. Neurologic: Alert and oriented X 3, normal strength and tone. Normal symmetric reflexes. Normal coordination and gait    Assessment:    Healthy female exam.      Plan:     Anticipatory guidance given including wearing seatbelts, smoke detectors in the home, increasing physical activity, increasing p.o. intake of water and vegetables. -will obtain labs -mammogram scheduled -Pap  scheduled with OB/GYN -given handout -next CPE in 1 yr See After Visit Summary for Counseling Recommendations    Sciatica -Discussed supportive care including NSAIDs stretching, massage, heat, muscle relaxer PRN.  Patient declines medication at this time -Given handout -Continue to monitor  Goiter -We will obtain TSH, free T4 -Continue to observe.  Screening for cholesterol -We will obtain lipid panel  Screen for diabetes -We will obtain hemoglobin A1c  Follow-up PRN  Grier Mitts, MD

## 2019-06-11 LAB — CBC WITH DIFFERENTIAL/PLATELET
Basophils Absolute: 0 10*3/uL (ref 0.0–0.1)
Basophils Relative: 0.8 % (ref 0.0–3.0)
Eosinophils Absolute: 0.1 10*3/uL (ref 0.0–0.7)
Eosinophils Relative: 1.1 % (ref 0.0–5.0)
HCT: 38.5 % (ref 36.0–46.0)
Hemoglobin: 12.5 g/dL (ref 12.0–15.0)
Lymphocytes Relative: 21.5 % (ref 12.0–46.0)
Lymphs Abs: 1.4 10*3/uL (ref 0.7–4.0)
MCHC: 32.4 g/dL (ref 30.0–36.0)
MCV: 87.2 fl (ref 78.0–100.0)
Monocytes Absolute: 0.4 10*3/uL (ref 0.1–1.0)
Monocytes Relative: 6.1 % (ref 3.0–12.0)
Neutro Abs: 4.4 10*3/uL (ref 1.4–7.7)
Neutrophils Relative %: 70.5 % (ref 43.0–77.0)
Platelets: 212 10*3/uL (ref 150.0–400.0)
RBC: 4.42 Mil/uL (ref 3.87–5.11)
RDW: 13.2 % (ref 11.5–15.5)
WBC: 6.3 10*3/uL (ref 4.0–10.5)

## 2019-06-15 ENCOUNTER — Telehealth: Payer: Self-pay

## 2019-06-15 NOTE — Telephone Encounter (Signed)
Copied from [place] [id number] [id number]. Topic: General - Other >> Jun 14, 2019  1:59 PM Leward Quan A wrote: Reason for CRM: Patient called to get lab results from [facility] please call her at Ph# [phone] >> Jun 15, 2019  9:51 AM Scherrie Gerlach wrote: Pt calling back for lab results please call her

## 2019-12-24 ENCOUNTER — Other Ambulatory Visit: Payer: Self-pay | Admitting: Surgery

## 2019-12-24 DIAGNOSIS — N6313 Unspecified lump in the right breast, lower outer quadrant: Secondary | ICD-10-CM

## 2019-12-27 ENCOUNTER — Other Ambulatory Visit: Payer: Self-pay | Admitting: Surgery

## 2019-12-27 DIAGNOSIS — N6313 Unspecified lump in the right breast, lower outer quadrant: Secondary | ICD-10-CM

## 2020-01-04 ENCOUNTER — Other Ambulatory Visit: Payer: 59

## 2020-01-05 ENCOUNTER — Ambulatory Visit
Admission: RE | Admit: 2020-01-05 | Discharge: 2020-01-05 | Disposition: A | Discharge status: Home / Self Care | Payer: 59 | Source: Ambulatory Visit | Attending: Surgery | Admitting: Surgery

## 2020-01-05 ENCOUNTER — Other Ambulatory Visit: Payer: Self-pay | Admitting: Surgery

## 2020-01-05 ENCOUNTER — Other Ambulatory Visit: Payer: Self-pay

## 2020-01-05 DIAGNOSIS — N6313 Unspecified lump in the right breast, lower outer quadrant: Secondary | ICD-10-CM

## 2020-02-21 LAB — HM COLONOSCOPY

## 2020-02-23 ENCOUNTER — Encounter: Payer: Self-pay | Admitting: Family Medicine

## 2020-03-01 ENCOUNTER — Telehealth: Payer: Self-pay | Admitting: Family Medicine

## 2020-03-01 NOTE — Telephone Encounter (Signed)
[facility] [phone] Gae Bon  Wanting orders for labs   Dr. Melissa Noon Auto immune disease or inflammartory diease  ANA ESR CRP CBC RHF ACE [id number] CHEST XRAY  Diagnosis: Chronic bilateral iritis

## 2020-03-02 NOTE — Telephone Encounter (Signed)
Okay for lab orders

## 2020-03-09 ENCOUNTER — Other Ambulatory Visit (INDEPENDENT_AMBULATORY_CARE_PROVIDER_SITE_OTHER): Payer: 59

## 2020-03-09 ENCOUNTER — Ambulatory Visit (INDEPENDENT_AMBULATORY_CARE_PROVIDER_SITE_OTHER)
Admission: RE | Admit: 2020-03-09 | Discharge: 2020-03-09 | Disposition: A | Discharge status: Home / Self Care | Payer: 59 | Source: Ambulatory Visit | Attending: Family Medicine | Admitting: Family Medicine

## 2020-03-09 ENCOUNTER — Encounter: Payer: Self-pay | Admitting: Family Medicine

## 2020-03-09 ENCOUNTER — Other Ambulatory Visit: Payer: Self-pay

## 2020-03-09 ENCOUNTER — Ambulatory Visit: Payer: 59 | Admitting: Family Medicine

## 2020-03-09 VITALS — BP 120/70 | HR 68 | Temp 97.4°F | Ht 64.0 in | Wt 201.1 lb

## 2020-03-09 DIAGNOSIS — M5432 Sciatica, left side: Secondary | ICD-10-CM

## 2020-03-09 DIAGNOSIS — H2013 Chronic iridocyclitis, bilateral: Secondary | ICD-10-CM | POA: Diagnosis not present

## 2020-03-09 LAB — CBC WITH DIFFERENTIAL/PLATELET
Basophils Absolute: 0.1 10*3/uL (ref 0.0–0.1)
Basophils Relative: 0.9 % (ref 0.0–3.0)
Eosinophils Absolute: 0.1 10*3/uL (ref 0.0–0.7)
Eosinophils Relative: 0.9 % (ref 0.0–5.0)
HCT: 38.6 % (ref 36.0–46.0)
Hemoglobin: 12.5 g/dL (ref 12.0–15.0)
Lymphocytes Relative: 24.6 % (ref 12.0–46.0)
Lymphs Abs: 1.9 10*3/uL (ref 0.7–4.0)
MCHC: 32.4 g/dL (ref 30.0–36.0)
MCV: 86.7 fl (ref 78.0–100.0)
Monocytes Absolute: 0.4 10*3/uL (ref 0.1–1.0)
Monocytes Relative: 5 % (ref 3.0–12.0)
Neutro Abs: 5.3 10*3/uL (ref 1.4–7.7)
Neutrophils Relative %: 68.6 % (ref 43.0–77.0)
Platelets: 211 10*3/uL (ref 150.0–400.0)
RBC: 4.45 Mil/uL (ref 3.87–5.11)
RDW: 12.7 % (ref 11.5–15.5)
WBC: 7.7 10*3/uL (ref 4.0–10.5)

## 2020-03-09 NOTE — Patient Instructions (Signed)
Sciatica  Sciatica is pain, numbness, weakness, or tingling along the path of the sciatic nerve. The sciatic nerve starts in the lower back and runs down the back of each leg. The nerve controls the muscles in the lower leg and in the back of the knee. It also provides feeling (sensation) to the back of the thigh, the lower leg, and the sole of the foot. Sciatica is a symptom of another medical condition that pinches or puts pressure on the sciatic nerve. Sciatica most often only affects one side of the body. Sciatica usually goes away on its own or with treatment. In some cases, sciatica may come back (recur). What are the causes? This condition is caused by pressure on the sciatic nerve or pinching of the nerve. This may be the result of:  A disk in between the bones of the spine bulging out too far (herniated disk).  Age-related changes in the spinal disks.  A pain disorder that affects a muscle in the buttock.  Extra bone growth near the sciatic nerve.  A break (fracture) of the pelvis.  Pregnancy.  Tumor. This is rare. What increases the risk? The following factors may make you more likely to develop this condition:  Playing sports that place pressure or stress on the spine.  Having poor strength and flexibility.  A history of back injury or surgery.  Sitting for long periods of time.  Doing activities that involve repetitive bending or lifting.  Obesity. What are the signs or symptoms? Symptoms can vary from mild to very severe, and they may include:  Any of these problems in the lower back, leg, hip, or buttock: ? Mild tingling, numbness, or dull aches. ? Burning sensations. ? Sharp pains.  Numbness in the back of the calf or the sole of the foot.  Leg weakness.  Severe back pain that makes movement difficult. Symptoms may get worse when you cough, sneeze, or laugh, or when you sit or stand for long periods of time. How is this diagnosed? This condition may be  diagnosed based on:  Your symptoms and medical history.  A physical exam.  Blood tests.  Imaging tests, such as: ? X-rays. ? MRI. ? CT scan. How is this treated? In many cases, this condition improves on its own without treatment. However, treatment may include:  Reducing or modifying physical activity.  Exercising and stretching.  Icing and applying heat to the affected area.  Medicines that help to: ? Relieve pain and swelling. ? Relax your muscles.  Injections of medicines that help to relieve pain, irritation, and inflammation around the sciatic nerve (steroids).  Surgery. Follow these instructions at home: Medicines  Take over-the-counter and prescription medicines only as told by your health care provider.  Ask your health care provider if the medicine prescribed to you: ? Requires you to avoid driving or using heavy machinery. ? Can cause constipation. You may need to take these actions to prevent or treat constipation:  Drink enough fluid to keep your urine pale yellow.  Take over-the-counter or prescription medicines.  Eat foods that are high in fiber, such as beans, whole grains, and fresh fruits and vegetables.  Limit foods that are high in fat and processed sugars, such as fried or sweet foods. Managing pain      If directed, put ice on the affected area. ? Put ice in a plastic bag. ? Place a towel between your skin and the bag. ? Leave the ice on for 20 minutes,   2-3 times a day.  If directed, apply heat to the affected area. Use the heat source that your health care provider recommends, such as a moist heat pack or a heating pad. ? Place a towel between your skin and the heat source. ? Leave the heat on for 20-30 minutes. ? Remove the heat if your skin turns bright red. This is especially important if you are unable to feel pain, heat, or cold. You may have a greater risk of getting burned. Activity   Return to your normal activities as told  by your health care provider. Ask your health care provider what activities are safe for you.  Avoid activities that make your symptoms worse.  Take brief periods of rest throughout the day. ? When you rest for longer periods, mix in some mild activity or stretching between periods of rest. This will help to prevent stiffness and pain. ? Avoid sitting for long periods of time without moving. Get up and move around at least one time each hour.  Exercise and stretch regularly, as told by your health care provider.  Do not lift anything that is heavier than 10 lb (4.5 kg) while you have symptoms of sciatica. When you do not have symptoms, you should still avoid heavy lifting, especially repetitive heavy lifting.  When you lift objects, always use proper lifting technique, which includes: ? Bending your knees. ? Keeping the load close to your body. ? Avoiding twisting. General instructions  Maintain a healthy weight. Excess weight puts extra stress on your back.  Wear supportive, comfortable shoes. Avoid wearing high heels.  Avoid sleeping on a mattress that is too soft or too hard. A mattress that is firm enough to support your back when you sleep may help to reduce your pain.  Keep all follow-up visits as told by your health care provider. This is important. Contact a health care provider if:  You have pain that: ? Wakes you up when you are sleeping. ? Gets worse when you lie down. ? Is worse than you have experienced in the past. ? Lasts longer than 4 weeks.  You have an unexplained weight loss. Get help right away if:  You are not able to control when you urinate or have bowel movements (incontinence).  You have: ? Weakness in your lower back, pelvis, buttocks, or legs that gets worse. ? Redness or swelling of your back. ? A burning sensation when you urinate. Summary  Sciatica is pain, numbness, weakness, or tingling along the path of the sciatic nerve.  This condition  is caused by pressure on the sciatic nerve or pinching of the nerve.  Sciatica can cause pain, numbness, or tingling in the lower back, legs, hips, and buttocks.  Treatment often includes rest, exercise, medicines, and applying ice or heat. This information is not intended to replace advice given to you by your health care provider. Make sure you discuss any questions you have with your health care provider. Document Revised: 09/07/2018 Document Reviewed: 09/07/2018 Elsevier Patient Education  2020 [employer] Sciatica Rehab Ask your health care provider which exercises are safe for you. Do exercises exactly as told by your health care provider and adjust them as directed. It is normal to feel mild stretching, pulling, tightness, or discomfort as you do these exercises. Stop right away if you feel sudden pain or your pain gets worse. Do not begin these exercises until told by your health care provider. Stretching and range-of-motion exercises These exercises warm up   your muscles and joints and improve the movement and flexibility of your hips and back. These exercises also help to relieve pain, numbness, and tingling. Sciatic nerve glide 1. Sit in a chair with your head facing down toward your chest. Place your hands behind your back. Let your shoulders slump forward. 2. Slowly straighten one of your legs while you tilt your head back as if you are looking toward the ceiling. Only straighten your leg as far as you can without making your symptoms worse. 3. Hold this position for __________ seconds. 4. Slowly return to the starting position. 5. Repeat with your other leg. Repeat __________ times. Complete this exercise __________ times a day. Knee to chest with hip adduction and internal rotation  1. Lie on your back on a firm surface with both legs straight. 2. Bend one of your knees and move it up toward your chest until you feel a gentle stretch in your lower back and buttock. Then, move  your knee toward the shoulder that is on the opposite side from your leg. This is hip adduction and internal rotation. ? Hold your leg in this position by holding on to the front of your knee. 3. Hold this position for __________ seconds. 4. Slowly return to the starting position. 5. Repeat with your other leg. Repeat __________ times. Complete this exercise __________ times a day. Prone extension on elbows  1. Lie on your abdomen on a firm surface. A bed may be too soft for this exercise. 2. Prop yourself up on your elbows. 3. Use your arms to help lift your chest up until you feel a gentle stretch in your abdomen and your lower back. ? This will place some of your body weight on your elbows. If this is uncomfortable, try stacking pillows under your chest. ? Your hips should stay down, against the surface that you are lying on. Keep your hip and back muscles relaxed. 4. Hold this position for __________ seconds. 5. Slowly relax your upper body and return to the starting position. Repeat __________ times. Complete this exercise __________ times a day. Strengthening exercises These exercises build strength and endurance in your back. Endurance is the ability to use your muscles for a long time, even after they get tired. Pelvic tilt This exercise strengthens the muscles that lie deep in the abdomen. 1. Lie on your back on a firm surface. Bend your knees and keep your feet flat on the floor. 2. Tense your abdominal muscles. Tip your pelvis up toward the ceiling and flatten your lower back into the floor. ? To help with this exercise, you may place a small towel under your lower back and try to push your back into the towel. 3. Hold this position for __________ seconds. 4. Let your muscles relax completely before you repeat this exercise. Repeat __________ times. Complete this exercise __________ times a day. Alternating arm and leg raises  1. Get on your hands and knees on a firm surface.  If you are on a hard floor, you may want to use padding, such as an exercise mat, to cushion your knees. 2. Line up your arms and legs. Your hands should be directly below your shoulders, and your knees should be directly below your hips. 3. Lift your left leg behind you. At the same time, raise your right arm and straighten it in front of you. ? Do not lift your leg higher than your hip. ? Do not lift your arm higher than your shoulder. ?   shoulder. ? Keep your abdominal and back muscles tight. ? Keep your hips facing the ground. ? Do not arch your back. ? Keep your balance carefully, and do not hold your breath. 4. Hold this position for __________ seconds. 5. Slowly return to the starting position. 6. Repeat with your right leg and your left arm. Repeat __________ times. Complete this exercise __________ times a day. Posture and body mechanics Good posture and healthy body mechanics can help to relieve stress in your body's tissues and joints. Body mechanics refers to the movements and positions of your body while you do your daily activities. Posture is part of body mechanics. Good posture means:  Your spine is in its natural S-curve position (neutral).  Your shoulders are pulled back slightly.  Your head is not tipped forward. Follow these guidelines to improve your posture and body mechanics in your everyday activities. Standing   When standing, keep your spine neutral and your feet about hip width apart. Keep a slight bend in your knees. Your ears, shoulders, and hips should line up.  When you do a task in which you stand in one place for a long time, place one foot up on a stable object that is 2-4 inches (5-10 cm) high, such as a footstool. This helps keep your spine neutral. Sitting   When sitting, keep your spine neutral and keep your feet flat on the floor. Use a footrest, if necessary, and keep your thighs parallel to the floor. Avoid rounding your shoulders, and avoid tilting your  head forward.  When working at a desk or a computer, keep your desk at a height where your hands are slightly lower than your elbows. Slide your chair under your desk so you are close enough to maintain good posture.  When working at a computer, place your monitor at a height where you are looking straight ahead and you do not have to tilt your head forward or downward to look at the screen. Resting  When lying down and resting, avoid positions that are most painful for you.  If you have pain with activities such as sitting, bending, stooping, or squatting, lie in a position in which your body does not bend very much. For example, avoid curling up on your side with your arms and knees near your chest (fetal position).  If you have pain with activities such as standing for a long time or reaching with your arms, lie with your spine in a neutral position and bend your knees slightly. Try the following positions: ? Lying on your side with a pillow between your knees. ? Lying on your back with a pillow under your knees. Lifting   When lifting objects, keep your feet at least shoulder width apart and tighten your abdominal muscles.  Bend your knees and hips and keep your spine neutral. It is important to lift using the strength of your legs, not your back. Do not lock your knees straight out.  Always ask for help to lift heavy or awkward objects. This information is not intended to replace advice given to you by your health care provider. Make sure you discuss any questions you have with your health care provider. Document Revised: 12/11/2018 Document Reviewed: 09/10/2018 Elsevier Patient Education  [place].  Sciatica Rehab Ask your health care provider which exercises are safe for you. Do exercises exactly as told by your health care provider and adjust them as directed. It is normal to feel mild stretching, pulling, tightness,  or discomfort as you do these exercises. Stop right  away if you feel sudden pain or your pain gets worse. Do not begin these exercises until told by your health care provider. Stretching and range-of-motion exercises These exercises warm up your muscles and joints and improve the movement and flexibility of your hips and back. These exercises also help to relieve pain, numbness, and tingling. Sciatic nerve glide 6. Sit in a chair with your head facing down toward your chest. Place your hands behind your back. Let your shoulders slump forward. 7. Slowly straighten one of your legs while you tilt your head back as if you are looking toward the ceiling. Only straighten your leg as far as you can without making your symptoms worse. 8. Hold this position for __________ seconds. 9. Slowly return to the starting position. 10. Repeat with your other leg. Repeat __________ times. Complete this exercise __________ times a day. Knee to chest with hip adduction and internal rotation  6. Lie on your back on a firm surface with both legs straight. 7. Bend one of your knees and move it up toward your chest until you feel a gentle stretch in your lower back and buttock. Then, move your knee toward the shoulder that is on the opposite side from your leg. This is hip adduction and internal rotation. ? Hold your leg in this position by holding on to the front of your knee. 8. Hold this position for __________ seconds. 9. Slowly return to the starting position. 10. Repeat with your other leg. Repeat __________ times. Complete this exercise __________ times a day. Prone extension on elbows  6. Lie on your abdomen on a firm surface. A bed may be too soft for this exercise. 7. Prop yourself up on your elbows. 8. Use your arms to help lift your chest up until you feel a gentle stretch in your abdomen and your lower back. ? This will place some of your body weight on your elbows. If this is uncomfortable, try stacking pillows under your chest. ? Your hips should stay  down, against the surface that you are lying on. Keep your hip and back muscles relaxed. 9. Hold this position for __________ seconds. 10. Slowly relax your upper body and return to the starting position. Repeat __________ times. Complete this exercise __________ times a day. Strengthening exercises These exercises build strength and endurance in your back. Endurance is the ability to use your muscles for a long time, even after they get tired. Pelvic tilt This exercise strengthens the muscles that lie deep in the abdomen. 5. Lie on your back on a firm surface. Bend your knees and keep your feet flat on the floor. 6. Tense your abdominal muscles. Tip your pelvis up toward the ceiling and flatten your lower back into the floor. ? To help with this exercise, you may place a small towel under your lower back and try to push your back into the towel. 7. Hold this position for __________ seconds. 8. Let your muscles relax completely before you repeat this exercise. Repeat __________ times. Complete this exercise __________ times a day. Alternating arm and leg raises  7. Get on your hands and knees on a firm surface. If you are on a hard floor, you may want to use padding, such as an exercise mat, to cushion your knees. 8. Line up your arms and legs. Your hands should be directly below your shoulders, and your knees should be directly below your hips. 9. Lift  your left leg behind you. At the same time, raise your right arm and straighten it in front of you. ? Do not lift your leg higher than your hip. ? Do not lift your arm higher than your shoulder. ? Keep your abdominal and back muscles tight. ? Keep your hips facing the ground. ? Do not arch your back. ? Keep your balance carefully, and do not hold your breath. 10. Hold this position for __________ seconds. 11. Slowly return to the starting position. 12. Repeat with your right leg and your left arm. Repeat __________ times. Complete this  exercise __________ times a day. Posture and body mechanics Good posture and healthy body mechanics can help to relieve stress in your body's tissues and joints. Body mechanics refers to the movements and positions of your body while you do your daily activities. Posture is part of body mechanics. Good posture means:  Your spine is in its natural S-curve position (neutral).  Your shoulders are pulled back slightly.  Your head is not tipped forward. Follow these guidelines to improve your posture and body mechanics in your everyday activities. Standing   When standing, keep your spine neutral and your feet about hip width apart. Keep a slight bend in your knees. Your ears, shoulders, and hips should line up.  When you do a task in which you stand in one place for a long time, place one foot up on a stable object that is 2-4 inches (5-10 cm) high, such as a footstool. This helps keep your spine neutral. Sitting   When sitting, keep your spine neutral and keep your feet flat on the floor. Use a footrest, if necessary, and keep your thighs parallel to the floor. Avoid rounding your shoulders, and avoid tilting your head forward.  When working at a desk or a computer, keep your desk at a height where your hands are slightly lower than your elbows. Slide your chair under your desk so you are close enough to maintain good posture.  When working at a computer, place your monitor at a height where you are looking straight ahead and you do not have to tilt your head forward or downward to look at the screen. Resting  When lying down and resting, avoid positions that are most painful for you.  If you have pain with activities such as sitting, bending, stooping, or squatting, lie in a position in which your body does not bend very much. For example, avoid curling up on your side with your arms and knees near your chest (fetal position).  If you have pain with activities such as standing for a long  time or reaching with your arms, lie with your spine in a neutral position and bend your knees slightly. Try the following positions: ? Lying on your side with a pillow between your knees. ? Lying on your back with a pillow under your knees. Lifting   When lifting objects, keep your feet at least shoulder width apart and tighten your abdominal muscles.  Bend your knees and hips and keep your spine neutral. It is important to lift using the strength of your legs, not your back. Do not lock your knees straight out.  Always ask for help to lift heavy or awkward objects. This information is not intended to replace advice given to you by your health care provider. Make sure you discuss any questions you have with your health care provider. Document Revised: 12/11/2018 Document Reviewed: 09/10/2018 Elsevier Patient Education  2020  [employer].
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
## 2020-03-10 NOTE — Telephone Encounter (Signed)
[facility] called wanting the labs faxed to there off before the patients appointment on March 14, 2020 with Dr. Melissa Noon   Fax number [phone]

## 2020-03-10 NOTE — Telephone Encounter (Signed)
Results faxed as requested

## 2020-03-14 ENCOUNTER — Telehealth: Payer: Self-pay | Admitting: Family Medicine

## 2020-03-14 ENCOUNTER — Other Ambulatory Visit: Payer: 59

## 2020-03-14 ENCOUNTER — Other Ambulatory Visit: Payer: Self-pay

## 2020-03-14 DIAGNOSIS — H2013 Chronic iridocyclitis, bilateral: Secondary | ICD-10-CM

## 2020-03-14 NOTE — Telephone Encounter (Signed)
Pt requesting refill  Medication Refill: Albuterol Sulfate 90 MCG  Ventolin FHA    Pharmacy:  [id number] - [place], [place] - [address] Phone:  [phone]  Fax:  [phone]

## 2020-03-14 NOTE — Telephone Encounter (Signed)
No longer needed

## 2020-03-14 NOTE — Telephone Encounter (Signed)
Renee Day from [facility] only receive the CBC lab and none of the others including the Chest x-ray. The pt has an appt now and they were hoping they can be sent asap   FAX: [phone]

## 2020-03-14 NOTE — Telephone Encounter (Signed)
Cbc and x-ray results faxed to [facility]. Becky informed that other labs were not completed at the time of the others, unsure why and would contact patient and see if she would come to office to have them completed. Spoke with patient and she will come today at 4 pm for labs.

## 2020-03-14 NOTE — Telephone Encounter (Signed)
Okay for refill? Please see result notes for info   Please advise

## 2020-03-15 ENCOUNTER — Encounter: Payer: Self-pay | Admitting: Family Medicine

## 2020-03-15 NOTE — Telephone Encounter (Signed)
Remaining lab results faxed to [facility] at [facility].

## 2020-03-16 ENCOUNTER — Telehealth: Payer: Self-pay | Admitting: Family Medicine

## 2020-03-16 LAB — C-REACTIVE PROTEIN: CRP: 3.7 mg/L (ref <8.0)

## 2020-03-16 LAB — SEDIMENTATION RATE: Sed Rate: 11 mm/h (ref 0–20)

## 2020-03-16 LAB — ANA: Anti Nuclear Antibody (ANA): NEGATIVE

## 2020-03-16 LAB — ANGIOTENSIN CONVERTING ENZYME: Angiotensin-Converting Enzyme: 10 U/L (ref 9–67)

## 2020-03-16 LAB — LUPUS ANTICOAGULANT EVAL W/ REFLEX
PTT-LA Screen: 32 s (ref <=40)
dRVVT: 33 s (ref <=45)

## 2020-03-16 LAB — RHEUMATOID FACTOR: Rheumatoid fact SerPl-aCnc: <14 IU/mL (ref <14)

## 2020-03-16 LAB — HLA-B27 ANTIGEN: HLA-B27 Antigen: NEGATIVE

## 2020-03-16 NOTE — Telephone Encounter (Signed)
Pt had lab work done 03/14/20 and would like to know if the results are available. Thanks

## 2020-03-17 NOTE — Telephone Encounter (Signed)
Patient viewed results on mychart on 03/17/2020. Tried calling patient, left vm for patient to return call to office with any questions.

## 2020-03-17 NOTE — Telephone Encounter (Signed)
Left a message for the pt to return my call.  

## 2020-03-20 ENCOUNTER — Telehealth: Payer: Self-pay | Admitting: Family Medicine

## 2020-03-20 NOTE — Telephone Encounter (Signed)
Becky with [facility], is requesting lab results for pt faxed to [phone]. Jacqlyn Larsen, says, that lab results that were faxed on Friday showed pt's A&A still pending. Thanks

## 2020-03-21 ENCOUNTER — Encounter: Payer: Self-pay | Admitting: Family Medicine

## 2020-03-21 NOTE — Telephone Encounter (Signed)
Pt lab results sent to [facility] as requested

## 2020-03-24 NOTE — Telephone Encounter (Signed)
Okay for refill of albuterol inhaler

## 2020-03-27 ENCOUNTER — Other Ambulatory Visit: Payer: Self-pay | Admitting: Family Medicine

## 2020-03-27 ENCOUNTER — Ambulatory Visit: Payer: 59 | Attending: Internal Medicine

## 2020-03-27 ENCOUNTER — Telehealth: Payer: Self-pay | Admitting: Family Medicine

## 2020-03-27 DIAGNOSIS — Z20822 Contact with and (suspected) exposure to covid-19: Secondary | ICD-10-CM

## 2020-03-27 MED ORDER — ALBUTEROL SULFATE HFA 108 (90 BASE) MCG/ACT IN AERS
2.0000 | INHALATION_SPRAY | Freq: Four times a day (QID) | RESPIRATORY_TRACT | 3 refills | Status: DC | PRN
Start: 2020-03-27 — End: 2022-04-08

## 2020-03-27 NOTE — Telephone Encounter (Signed)
Please advise on the sig.

## 2020-03-27 NOTE — Telephone Encounter (Signed)
Refill done.  

## 2020-03-27 NOTE — Telephone Encounter (Signed)
Pt called to follow up on her inhaler request. States the pharmacy still doesn't have the order for it.  [facility] [place] [address]  Pt can be reached at [phone]  Please advise.

## 2020-03-27 NOTE — Telephone Encounter (Signed)
Spoke with pt aware to pick Rx from her pharmacy

## 2020-03-28 LAB — NOVEL CORONAVIRUS, NAA: SARS-CoV-2, NAA: NOT DETECTED

## 2020-03-28 LAB — SARS-COV-2, NAA 2 DAY TAT

## 2020-05-09 ENCOUNTER — Other Ambulatory Visit: Payer: 59

## 2020-05-09 ENCOUNTER — Other Ambulatory Visit: Payer: Self-pay

## 2020-05-09 DIAGNOSIS — Z20822 Contact with and (suspected) exposure to covid-19: Secondary | ICD-10-CM

## 2020-05-10 LAB — NOVEL CORONAVIRUS, NAA: SARS-CoV-2, NAA: NOT DETECTED

## 2020-05-10 LAB — SARS-COV-2, NAA 2 DAY TAT

## 2020-05-25 ENCOUNTER — Other Ambulatory Visit: Payer: Self-pay | Admitting: Internal Medicine

## 2020-05-25 ENCOUNTER — Other Ambulatory Visit: Payer: 59

## 2020-05-25 DIAGNOSIS — Z20822 Contact with and (suspected) exposure to covid-19: Secondary | ICD-10-CM

## 2020-05-27 LAB — SPECIMEN STATUS REPORT

## 2020-05-27 LAB — SARS-COV-2, NAA 2 DAY TAT

## 2020-05-27 LAB — NOVEL CORONAVIRUS, NAA: SARS-CoV-2, NAA: NOT DETECTED

## 2020-06-12 ENCOUNTER — Other Ambulatory Visit: Payer: 59

## 2020-06-14 ENCOUNTER — Encounter: Payer: Self-pay | Admitting: Family Medicine

## 2020-06-14 ENCOUNTER — Other Ambulatory Visit: Payer: Self-pay

## 2020-06-14 ENCOUNTER — Ambulatory Visit: Payer: 59 | Admitting: Family Medicine

## 2020-06-14 VITALS — BP 102/72 | HR 81 | Temp 98.3°F | Ht 64.0 in | Wt 219.4 lb

## 2020-06-14 DIAGNOSIS — E049 Nontoxic goiter, unspecified: Secondary | ICD-10-CM | POA: Diagnosis not present

## 2020-06-14 DIAGNOSIS — R635 Abnormal weight gain: Secondary | ICD-10-CM

## 2020-06-14 DIAGNOSIS — Z0000 Encounter for general adult medical examination without abnormal findings: Secondary | ICD-10-CM | POA: Diagnosis not present

## 2020-06-14 DIAGNOSIS — Z13220 Encounter for screening for lipoid disorders: Secondary | ICD-10-CM | POA: Diagnosis not present

## 2020-06-14 NOTE — Progress Notes (Signed)
Subjective:     Renee Day is a 49 y.o. female and is here for a comprehensive physical exam.  Pt has been doing well.  Endorses gaining weight since COVID 19 pandemic started.  Pt recently traveled to [place] with her husband who ran the [facility].    LMP 06/04/20.  Pap done Oct 2020 by OB/gyn.  Colonoscopy 02/21/20.  Mammogram 01/05/2020.  Social History   Socioeconomic History  . Marital status: Married    Spouse name: Not on file  . Number of children: Not on file  . Years of education: Not on file  . Highest education level: Not on file  Occupational History  . Not on file  Tobacco Use  . Smoking status: Never Smoker  . Smokeless tobacco: Never Used  Substance and Sexual Activity  . Alcohol use: Yes  . Drug use: Never  . Sexual activity: Yes  Other Topics Concern  . Not on file  Social History Narrative  . Not on file   Social Determinants of Health   Financial Resource Strain:   . Difficulty of Paying Living Expenses: Not on file  Food Insecurity:   . Worried About Charity fundraiser in the Last Year: Not on file  . Ran Out of Food in the Last Year: Not on file  Transportation Needs:   . Lack of Transportation (Medical): Not on file  . Lack of Transportation (Non-Medical): Not on file  Physical Activity:   . Days of Exercise per Week: Not on file  . Minutes of Exercise per Session: Not on file  Stress:   . Feeling of Stress : Not on file  Social Connections:   . Frequency of Communication with Friends and Family: Not on file  . Frequency of Social Gatherings with Friends and Family: Not on file  . Attends Religious Services: Not on file  . Active Member of Clubs or Organizations: Not on file  . Attends Archivist Meetings: Not on file  . Marital Status: Not on file  Intimate Partner Violence:   . Fear of Current or Ex-Partner: Not on file  . Emotionally Abused: Not on file  . Physically Abused: Not on file  . Sexually Abused: Not on  file   Health Maintenance  Topic Date Due  . Hepatitis C Screening  Never done  . HIV Screening  Never done  . INFLUENZA VACCINE  04/02/2020  . PAP SMEAR-Modifier  04/29/2020  . TETANUS/TDAP  05/13/2027  . COVID-19 Vaccine  Completed    The following portions of the patient's history were reviewed and updated as appropriate: allergies, current medications, past family history, past medical history, past social history, past surgical history and problem list.  Review of Systems Pertinent items noted in HPI and remainder of comprehensive ROS otherwise negative.   Objective:    BP 102/72 (BP Location: Left Arm, Patient Position: Sitting, Cuff Size: Normal)   Pulse 81   Temp 98.3 F (36.8 C) (Oral)   Ht 5\' 4"  (1.626 m)   Wt 219 lb 6.4 oz (99.5 kg)   BMI 37.66 kg/m  General appearance: alert, cooperative and no distress Head: Normocephalic, without obvious abnormality, atraumatic Eyes: conjunctivae/corneas clear. PERRL, EOM's intact. Fundi benign. Ears: normal TM's and external ear canals both ears Nose: Nares normal. Septum midline. Mucosa normal. No drainage or sinus tenderness. Throat: lips, mucosa, and tongue normal; teeth and gums normal Neck: no adenopathy, no carotid bruit, no JVD, supple, symmetrical, trachea midline and  Thyroid enlarged, R>>L, smooth, without nodules, nontender Lungs: clear to auscultation bilaterally Heart: regular rate and rhythm, S1, S2 normal, no murmur, click, rub or gallop Abdomen: soft, non-tender; bowel sounds normal; no masses,  no organomegaly Extremities: extremities normal, atraumatic, no cyanosis or edema Pulses: 2+ and symmetric Skin: Skin color, texture, turgor normal. No rashes or lesions Lymph nodes: Cervical, supraclavicular, and axillary nodes normal. Neurologic: Alert and oriented X 3, normal strength and tone. Normal symmetric reflexes. Normal coordination and gait    Assessment:    Healthy female exam with goiter.     Plan:      Anticipatory guidance given including wearing seatbelts, smoke detectors in the home, increasing physical activity, increasing p.o. intake of water and vegetables. -We will obtain labs -Pap up-to-date per patient done October 2020.  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  Previously followed by Endo in [place]. -will obtain labs and make further recommendation if needed. - Plan: TSH, T4, free  Weight gain -Continue lifestyle modifications including increasing physical activity and decreasing portion sizes. - Plan: TSH, T4, free, Hemoglobin A1c  Screening for cholesterol level -Continue lifestyle modifications - Plan: Lipid panel  F/u prn  Grier Mitts, MD

## 2020-06-14 NOTE — Addendum Note (Signed)
Addended by: Marrion Coy on: 06/14/2020 08:46 AM   Modules accepted: Orders

## 2020-06-14 NOTE — Patient Instructions (Signed)
Preventive Care 40-47 Years Old, Female Preventive care refers to visits with your health care provider and lifestyle choices that can promote health and wellness. This includes:  A yearly physical exam. This may also be called an annual well check.  Regular dental visits and eye exams.  Immunizations.  Screening for certain conditions.  Healthy lifestyle choices, such as eating a healthy diet, getting regular exercise, not using drugs or products that contain nicotine and tobacco, and limiting alcohol use. What can I expect for my preventive care visit? Physical exam Your health care provider will check your:  Height and weight. This may be used to calculate body mass index (BMI), which tells if you are at a healthy weight.  Heart rate and blood pressure.  Skin for abnormal spots. Counseling Your health care provider may ask you questions about your:  Alcohol, tobacco, and drug use.  Emotional well-being.  Home and relationship well-being.  Sexual activity.  Eating habits.  Work and work environment.  Method of birth control.  Menstrual cycle.  Pregnancy history. What immunizations do I need?  Influenza (flu) vaccine  This is recommended every year. Tetanus, diphtheria, and pertussis (Tdap) vaccine  You may need a Td booster every 10 years. Varicella (chickenpox) vaccine  You may need this if you have not been vaccinated. Zoster (shingles) vaccine  You may need this after age 60. Measles, mumps, and rubella (MMR) vaccine  You may need at least one dose of MMR if you were born in 1957 or later. You may also need a second dose. Pneumococcal conjugate (PCV13) vaccine  You may need this if you have certain conditions and were not previously vaccinated. Pneumococcal polysaccharide (PPSV23) vaccine  You may need one or two doses if you smoke cigarettes or if you have certain conditions. Meningococcal conjugate (MenACWY) vaccine  You may need this if you  have certain conditions. Hepatitis A vaccine  You may need this if you have certain conditions or if you travel or work in places where you may be exposed to hepatitis A. Hepatitis B vaccine  You may need this if you have certain conditions or if you travel or work in places where you may be exposed to hepatitis B. Haemophilus influenzae type b (Hib) vaccine  You may need this if you have certain conditions. Human papillomavirus (HPV) vaccine  If recommended by your health care provider, you may need three doses over 6 months. You may receive vaccines as individual doses or as more than one vaccine together in one shot (combination vaccines). Talk with your health care provider about the risks and benefits of combination vaccines. What tests do I need? Blood tests  Lipid and cholesterol levels. These may be checked every 5 years, or more frequently if you are over 50 years old.  Hepatitis C test.  Hepatitis B test. Screening  Lung cancer screening. You may have this screening every year starting at age 55 if you have a 30-pack-year history of smoking and currently smoke or have quit within the past 15 years.  Colorectal cancer screening. All adults should have this screening starting at age 50 and continuing until age 75. Your health care provider may recommend screening at age 45 if you are at increased risk. You will have tests every 1-10 years, depending on your results and the type of screening test.  Diabetes screening. This is done by checking your blood sugar (glucose) after you have not eaten for a while (fasting). You may have this   done every 1-3 years.  Mammogram. This may be done every 1-2 years. Talk with your health care provider about when you should start having regular mammograms. This may depend on whether you have a family history of breast cancer.  BRCA-related cancer screening. This may be done if you have a family history of breast, ovarian, tubal, or peritoneal  cancers.  Pelvic exam and Pap test. This may be done every 3 years starting at age 28. Starting at age 86, this may be done every 5 years if you have a Pap test in combination with an HPV test. Other tests  Sexually transmitted disease (STD) testing.  Bone density scan. This is done to screen for osteoporosis. You may have this scan if you are at high risk for osteoporosis. Follow these instructions at home: Eating and drinking  Eat a diet that includes fresh fruits and vegetables, whole grains, lean protein, and low-fat dairy.  Take vitamin and mineral supplements as recommended by your health care provider.  Do not drink alcohol if: ? Your health care provider tells you not to drink. ? You are pregnant, may be pregnant, or are planning to become pregnant.  If you drink alcohol: ? Limit how much you have to 0-1 drink a day. ? Be aware of how much alcohol is in your drink. In the [place] one drink equals one 12 oz bottle of beer (355 mL), one 5 oz glass of wine (148 mL), or one 1 oz glass of hard liquor (44 mL). Lifestyle  Take daily care of your teeth and gums.  Stay active. Exercise for at least 30 minutes on 5 or more days each week.  Do not use any products that contain nicotine or tobacco, such as cigarettes, e-cigarettes, and chewing tobacco. If you need help quitting, ask your health care provider.  If you are sexually active, practice safe sex. Use a condom or other form of birth control (contraception) in order to prevent pregnancy and STIs (sexually transmitted infections).  If told by your health care provider, take low-dose aspirin daily starting at age 45. What's next?  Visit your health care provider once a year for a well check visit.  Ask your health care provider how often you should have your eyes and teeth checked.  Stay up to date on all vaccines. This information is not intended to replace advice given to you by your health care provider. Make sure you  discuss any questions you have with your health care provider. Document Revised: 04/30/2018 Document Reviewed: 04/30/2018 Elsevier Patient Education  [place]  A goiter is an enlarged thyroid gland. The thyroid is located in the lower front of the neck. It makes hormones that affect many body parts and systems, including the system that affects how quickly the body burns fuel for energy (metabolism). Most goiters are painless and are not a cause for concern. Some goiters can affect the way your thyroid makes thyroid hormones. Goiters and conditions that cause goiters can be treated, if necessary. What are the causes? Common causes of this condition include:  Lack (deficiency) of a mineral called iodine. The thyroid gland uses iodine to make thyroid hormones.  Diseases that attack healthy cells in the body (autoimmune diseases) and affect thyroid function, such as Graves' disease or Hashimoto's disease. These diseases may cause the body to produce too much thyroid hormone (hyperthyroidism) or too little of the hormone (hypothyroidism).  Conditions that cause inflammation of the thyroid (thyroiditis).  One  or more small growths on the thyroid (nodular goiter). Other causes include:  Medical problems caused by abnormal genes that are passed from parent to child (genetic defects).  Thyroid injury or infection.  Tumors that may or may not be cancerous.  Pregnancy.  Certain medicines.  Exposure to radiation. In some cases, the cause may not be known. What increases the risk? This condition is more likely to develop in:  People who do not get enough iodine in their diet.  People who have a family history of goiter.  Women.  People who are older than age 3.  People who smoke tobacco.  People who have had exposure to radiation. What are the signs or symptoms? The main symptom of this condition is swelling in the lower, front part of the neck. This swelling  can range from a very small bump to a large lump. Other symptoms may include:  A tight feeling in the throat.  A hoarse voice.  Coughing.  Wheezing.  Difficulty swallowing or breathing.  Bulging veins in the neck.  Dizziness. When a goiter is the result of an overactive thyroid (hyperthyroidism), symptoms may also include:  Nervousness or restlessness.  Inability to tolerate heat.  Unexplained weight loss.  Diarrhea.  Change in the texture of hair or skin.  Changes in heartbeat, such as skipped beats, extra beats, or a rapid heart rate.  Loss of menstruation.  Shaky hands.  Increased appetite.  Sleep problems. When a goiter is the result of an underactive thyroid (hypothyroidism), symptoms may also include:  Feeling like you have no energy (lethargy).  Inability to tolerate cold.  Weight gain that is not explained by a change in diet or exercise habits.  Dry skin.  Coarse hair.  Irregular menstrual periods.  Constipation.  Sadness or depression.  Fatigue. In some cases, there may not be any symptoms and the thyroid hormone levels may be normal. How is this diagnosed? This condition may be diagnosed based on your symptoms, your medical history, and a physical exam. You may have tests, such as:  Blood tests to check thyroid function.  Imaging tests, such as: ? Ultrasound. ? CT scan. ? MRI. ? Thyroid scan.  Removal of a tissue sample (biopsy) of the goiter or any nodules. The sample will be tested to check for cancer. How is this treated? Treatment for this condition depends on the cause and your symptoms. Treatment may include:  Medicines to regulate thyroid hormone levels.  Anti-inflammatory medicines or steroid medicines, if the goiter is caused by inflammation.  Iodine supplements or changes to your diet, if the goiter is caused by iodine deficiency.  Radioactive iodine treatment.  Surgery to remove your thyroid. In some cases, you may  only need regular check-ups with your health care provider to monitor your condition, and you may not need treatment. Follow these instructions at home:  Follow instructions from your health care provider about any changes to your diet.  Take over-the-counter and prescription medicines only as told by your health care provider. These include supplements.  Do not use any products that contain nicotine or tobacco, such as cigarettes and e-cigarettes. If you need help quitting, ask your health care provider.  Keep all follow-up visits as told by your health care provider. This is important. Contact a health care provider if:  Your symptoms do not get better with treatment.  You have nausea, vomiting, or diarrhea. Get help right away if:  You have sudden, unexplained confusion or other mental  changes.  You have a fever.  You have chest pain.  You have trouble breathing or swallowing.  You suddenly become very weak.  You experience extreme restlessness.  You feel your heart racing. Summary  A goiter is an enlarged thyroid gland.  The thyroid gland is located in the lower front of the neck. It makes hormones that affect many body parts and systems, including the system that affects how quickly the body burns fuel for energy (metabolism).  The main symptom of this condition is swelling in the lower, front part of the neck. This swelling can range from a very small bump to a large lump.  Treatment for this condition depends on the cause and your symptoms. You may need medicines, supplements, or regular monitoring of your condition. This information is not intended to replace advice given to you by your health care provider. Make sure you discuss any questions you have with your health care provider. Document Revised: 08/01/2017 Document Reviewed: 05/15/2017 Elsevier Patient Education  2020 [employer].  Exercising to Lose Weight Exercise is structured, repetitive physical  activity to improve fitness and health. Getting regular exercise is important for everyone. It is especially important if you are overweight. Being overweight increases your risk of heart disease, stroke, diabetes, high blood pressure, and several types of cancer. Reducing your calorie intake and exercising can help you lose weight. Exercise is usually categorized as moderate or vigorous intensity. To lose weight, most people need to do a certain amount of moderate-intensity or vigorous-intensity exercise each week. Moderate-intensity exercise  Moderate-intensity exercise is any activity that gets you moving enough to burn at least three times more energy (calories) than if you were sitting. Examples of moderate exercise include:  Walking a mile in 15 minutes.  Doing light yard work.  Biking at an easy pace. Most people should get at least 150 minutes (2 hours and 30 minutes) a week of moderate-intensity exercise to maintain their body weight. Vigorous-intensity exercise Vigorous-intensity exercise is any activity that gets you moving enough to burn at least six times more calories than if you were sitting. When you exercise at this intensity, you should be working hard enough that you are not able to carry on a conversation. Examples of vigorous exercise include:  Running.  Playing a team sport, such as football, basketball, and soccer.  Jumping rope. Most people should get at least 75 minutes (1 hour and 15 minutes) a week of vigorous-intensity exercise to maintain their body weight. How can exercise affect me? When you exercise enough to burn more calories than you eat, you lose weight. Exercise also reduces body fat and builds muscle. The more muscle you have, the more calories you burn. Exercise also:  Improves mood.  Reduces stress and tension.  Improves your overall fitness, flexibility, and endurance.  Increases bone strength. The amount of exercise you need to lose weight  depends on:  Your age.  The type of exercise.  Any health conditions you have.  Your overall physical ability. Talk to your health care provider about how much exercise you need and what types of activities are safe for you. What actions can I take to lose weight? Nutrition   Make changes to your diet as told by your health care provider or diet and nutrition specialist (dietitian). This may include: ? Eating fewer calories. ? Eating more protein. ? Eating less unhealthy fats. ? Eating a diet that includes fresh fruits and vegetables, whole grains, low-fat dairy products, and  lean protein. ? Avoiding foods with added fat, salt, and sugar.  Drink plenty of water while you exercise to prevent dehydration or heat stroke. Activity  Choose an activity that you enjoy and set realistic goals. Your health care provider can help you make an exercise plan that works for you.  Exercise at a moderate or vigorous intensity most days of the week. ? The intensity of exercise may vary from person to person. You can tell how intense a workout is for you by paying attention to your breathing and heartbeat. Most people will notice their breathing and heartbeat get faster with more intense exercise.  Do resistance training twice each week, such as: ? Push-ups. ? Sit-ups. ? Lifting weights. ? Using resistance bands.  Getting short amounts of exercise can be just as helpful as long structured periods of exercise. If you have trouble finding time to exercise, try to include exercise in your daily routine. ? Get up, stretch, and walk around every 30 minutes throughout the day. ? Go for a walk during your lunch break. ? Park your car farther away from your destination. ? If you take public transportation, get off one stop early and walk the rest of the way. ? Make phone calls while standing up and walking around. ? Take the stairs instead of elevators or escalators.  Wear comfortable clothes and  shoes with good support.  Do not exercise so much that you hurt yourself, feel dizzy, or get very short of breath. Where to find more information  U.[place]. Department of Health and Human Services: [employer]  Centers for Disease Control and Prevention (CDC): http://www.wolf.info/ Contact a health care provider:  Before starting a new exercise program.  If you have questions or concerns about your weight.  If you have a medical problem that keeps you from exercising. Get help right away if you have any of the following while exercising:  Injury.  Dizziness.  Difficulty breathing or shortness of breath that does not go away when you stop exercising.  Chest pain.  Rapid heartbeat. Summary  Being overweight increases your risk of heart disease, stroke, diabetes, high blood pressure, and several types of cancer.  Losing weight happens when you burn more calories than you eat.  Reducing the amount of calories you eat in addition to getting regular moderate or vigorous exercise each week helps you lose weight. This information is not intended to replace advice given to you by your health care provider. Make sure you discuss any questions you have with your health care provider. Document Revised: 09/01/2017 Document Reviewed: 09/01/2017 Elsevier Patient Education  2020 [employer].

## 2020-06-15 LAB — LIPID PANEL
Cholesterol: 191 mg/dL (ref <200)
HDL: 58 mg/dL (ref >=50)
LDL Cholesterol (Calc): 108 mg/dL (calc) — ABNORMAL HIGH
Non-HDL Cholesterol (Calc): 133 mg/dL (calc) — ABNORMAL HIGH (ref <130)
Total CHOL/HDL Ratio: 3.3 (calc) (ref <5.0)
Triglycerides: 133 mg/dL (ref <150)

## 2020-06-15 LAB — CBC WITH DIFFERENTIAL/PLATELET
Absolute Monocytes: 365 cells/uL (ref 200–950)
Basophils Absolute: 49 cells/uL (ref 0–200)
Basophils Relative: 0.6 %
Eosinophils Absolute: 122 cells/uL (ref 15–500)
Eosinophils Relative: 1.5 %
HCT: 40.1 % (ref 35.0–45.0)
Hemoglobin: 12.9 g/dL (ref 11.7–15.5)
Lymphs Abs: 1976 cells/uL (ref 850–3900)
MCH: 27.7 pg (ref 27.0–33.0)
MCHC: 32.2 g/dL (ref 32.0–36.0)
MCV: 86.1 fL (ref 80.0–100.0)
MPV: 10.7 fL (ref 7.5–12.5)
Monocytes Relative: 4.5 %
Neutro Abs: 5589 cells/uL (ref 1500–7800)
Neutrophils Relative %: 69 %
Platelets: 260 10*3/uL (ref 140–400)
RBC: 4.66 10*6/uL (ref 3.80–5.10)
RDW: 11.6 % (ref 11.0–15.0)
Total Lymphocyte: 24.4 %
WBC: 8.1 10*3/uL (ref 3.8–10.8)

## 2020-06-15 LAB — BASIC METABOLIC PANEL WITH GFR
BUN: 8 mg/dL (ref 7–25)
CO2: 29 mmol/L (ref 20–32)
Calcium: 9 mg/dL (ref 8.6–10.2)
Chloride: 104 mmol/L (ref 98–110)
Creat: 0.72 mg/dL (ref 0.50–1.10)
GFR, Est African American: 116 mL/min/{1.73_m2} (ref >=60)
GFR, Est Non African American: 100 mL/min/{1.73_m2} (ref >=60)
Glucose, Bld: 87 mg/dL (ref 65–99)
Potassium: 4.4 mmol/L (ref 3.5–5.3)
Sodium: 138 mmol/L (ref 135–146)

## 2020-06-15 LAB — HEMOGLOBIN A1C
Hgb A1c MFr Bld: 5.4 % of total Hgb (ref <5.7)
Mean Plasma Glucose: 108 (calc)
eAG (mmol/L): 6 (calc)

## 2020-06-15 LAB — TSH: TSH: 0.76 mIU/L

## 2020-06-15 LAB — T4, FREE: Free T4: 1.1 ng/dL (ref 0.8–1.8)

## 2020-06-20 ENCOUNTER — Ambulatory Visit: Payer: 59 | Attending: Internal Medicine

## 2020-06-20 DIAGNOSIS — Z23 Encounter for immunization: Secondary | ICD-10-CM

## 2020-06-20 NOTE — Progress Notes (Signed)
   Covid-19 Vaccination Clinic  Name:  Renee Day    MRN: [id number] DOB: 1971-03-03  06/20/2020  Renee Day was observed post Covid-19 immunization for 15 minutes without incident. She was provided with Vaccine Information Sheet and instruction to access the V-Safe system.   Renee Day was instructed to call 911 with any severe reactions post vaccine: Marland Kitchen Difficulty breathing  . Swelling of face and throat  . A fast heartbeat  . A bad rash all over body  . Dizziness and weakness

## 2020-07-13 ENCOUNTER — Encounter: Payer: Self-pay | Admitting: Family Medicine

## 2020-07-13 ENCOUNTER — Ambulatory Visit (INDEPENDENT_AMBULATORY_CARE_PROVIDER_SITE_OTHER): Payer: 59 | Admitting: Family Medicine

## 2020-07-13 ENCOUNTER — Other Ambulatory Visit: Payer: Self-pay

## 2020-07-13 VITALS — BP 100/78 | HR 84 | Temp 98.7°F | Wt 222.4 lb

## 2020-07-13 DIAGNOSIS — E049 Nontoxic goiter, unspecified: Secondary | ICD-10-CM

## 2020-07-13 NOTE — Patient Instructions (Signed)
Goiter  A goiter is an enlarged thyroid gland. The thyroid is located in the lower front of the neck. It makes hormones that affect many body parts and systems, including the system that affects how quickly the body burns fuel for energy (metabolism). Most goiters are painless and are not a cause for concern. Some goiters can affect the way your thyroid makes thyroid hormones. Goiters and conditions that cause goiters can be treated, if necessary. What are the causes? Common causes of this condition include:  Lack (deficiency) of a mineral called iodine. The thyroid gland uses iodine to make thyroid hormones.  Diseases that attack healthy cells in the body (autoimmune diseases) and affect thyroid function, such as Graves' disease or Hashimoto's disease. These diseases may cause the body to produce too much thyroid hormone (hyperthyroidism) or too little of the hormone (hypothyroidism).  Conditions that cause inflammation of the thyroid (thyroiditis).  One or more small growths on the thyroid (nodular goiter). Other causes include:  Medical problems caused by abnormal genes that are passed from parent to child (genetic defects).  Thyroid injury or infection.  Tumors that may or may not be cancerous.  Pregnancy.  Certain medicines.  Exposure to radiation. In some cases, the cause may not be known. What increases the risk? This condition is more likely to develop in:  People who do not get enough iodine in their diet.  People who have a family history of goiter.  Women.  People who are older than age 40.  People who smoke tobacco.  People who have had exposure to radiation. What are the signs or symptoms? The main symptom of this condition is swelling in the lower, front part of the neck. This swelling can range from a very small bump to a large lump. Other symptoms may include:  A tight feeling in the throat.  A hoarse voice.  Coughing.  Wheezing.  Difficulty  swallowing or breathing.  Bulging veins in the neck.  Dizziness. When a goiter is the result of an overactive thyroid (hyperthyroidism), symptoms may also include:  Nervousness or restlessness.  Inability to tolerate heat.  Unexplained weight loss.  Diarrhea.  Change in the texture of hair or skin.  Changes in heartbeat, such as skipped beats, extra beats, or a rapid heart rate.  Loss of menstruation.  Shaky hands.  Increased appetite.  Sleep problems. When a goiter is the result of an underactive thyroid (hypothyroidism), symptoms may also include:  Feeling like you have no energy (lethargy).  Inability to tolerate cold.  Weight gain that is not explained by a change in diet or exercise habits.  Dry skin.  Coarse hair.  Irregular menstrual periods.  Constipation.  Sadness or depression.  Fatigue. In some cases, there may not be any symptoms and the thyroid hormone levels may be normal. How is this diagnosed? This condition may be diagnosed based on your symptoms, your medical history, and a physical exam. You may have tests, such as:  Blood tests to check thyroid function.  Imaging tests, such as: ? Ultrasound. ? CT scan. ? MRI. ? Thyroid scan.  Removal of a tissue sample (biopsy) of the goiter or any nodules. The sample will be tested to check for cancer. How is this treated? Treatment for this condition depends on the cause and your symptoms. Treatment may include:  Medicines to regulate thyroid hormone levels.  Anti-inflammatory medicines or steroid medicines, if the goiter is caused by inflammation.  Iodine supplements or changes to your   diet, if the goiter is caused by iodine deficiency.  Radioactive iodine treatment.  Surgery to remove your thyroid. In some cases, you may only need regular check-ups with your health care provider to monitor your condition, and you may not need treatment. Follow these instructions at home:  Follow  instructions from your health care provider about any changes to your diet.  Take over-the-counter and prescription medicines only as told by your health care provider. These include supplements.  Do not use any products that contain nicotine or tobacco, such as cigarettes and e-cigarettes. If you need help quitting, ask your health care provider.  Keep all follow-up visits as told by your health care provider. This is important. Contact a health care provider if:  Your symptoms do not get better with treatment.  You have nausea, vomiting, or diarrhea. Get help right away if:  You have sudden, unexplained confusion or other mental changes.  You have a fever.  You have chest pain.  You have trouble breathing or swallowing.  You suddenly become very weak.  You experience extreme restlessness.  You feel your heart racing. Summary  A goiter is an enlarged thyroid gland.  The thyroid gland is located in the lower front of the neck. It makes hormones that affect many body parts and systems, including the system that affects how quickly the body burns fuel for energy (metabolism).  The main symptom of this condition is swelling in the lower, front part of the neck. This swelling can range from a very small bump to a large lump.  Treatment for this condition depends on the cause and your symptoms. You may need medicines, supplements, or regular monitoring of your condition. This information is not intended to replace advice given to you by your health care provider. Make sure you discuss any questions you have with your health care provider. Document Revised: 08/01/2017 Document Reviewed: 05/15/2017 Elsevier Patient Education  2020 Elsevier Inc.  

## 2020-07-13 NOTE — Progress Notes (Signed)
Subjective:    Patient ID: Renee Day, female    DOB: [birth year], 49 y.o.   MRN: [id number]  No chief complaint on file. Pt's last name pronounced "Rah-bey".  HPI   Patient was seen today for follow-up. Patient seen by OB/GYN last week and had labs drawn. Patient received a phone call stating her TSH was low and a referral was placed with ENT. Patient had follow-up with ENT however was advised to follow-up in 1 year for possible repeat biopsy of thyroid. Patient's results have not been posted on her my chart from OB/GYN. Patient notes goiter has been stable. Patient denies difficulty swallowing, breathing. TSH checked on 06/14/2020 during CPE with this provider and was normal.  Patient was scheduled to have mammogram done today however had to reschedule 2/2 having a COVID booster within the last few weeks.  Social history: Patient is a prior Regulatory affairs officer.  She was able to enjoy homecoming with her sisters who came into town from [place], [place], and [place].    Past Medical History:  Diagnosis Date  . Asthma   . Blood in stool   . Migraine   . Thyroid disease   . UTI (urinary tract infection)     No Known Allergies  ROS General: Denies fever, chills, night sweats, changes in weight, changes in appetite   HEENT: Denies headaches, ear pain, changes in vision, rhinorrhea, sore throat + goiter CV: Denies CP, palpitations, SOB, orthopnea Pulm: Denies SOB, cough, wheezing GI: Denies abdominal pain, nausea, vomiting, diarrhea, constipation GU: Denies dysuria, hematuria, frequency, vaginal discharge Msk: Denies muscle cramps, joint pains Neuro: Denies weakness, numbness, tingling Skin: Denies rashes, bruising Psych: Denies depression, anxiety, hallucinations      Objective:    Blood pressure 100/78, pulse 84, temperature 98.7 F (37.1 C), temperature source Oral, weight 222 lb 6.4 oz (100.9 kg), SpO2 97 %.   Gen. Pleasant, well-nourished, in no distress, normal affect    HEENT: [place]/AT, face symmetric, conjunctiva clear, no scleral icterus, PERRLA, EOMI, nares patent without drainage Neck:  thyromegaly Lungs: no accessory muscle use Cardiovascular: RRR, no peripheral edema Musculoskeletal: No deformities, no cyanosis or clubbing, normal tone Neuro:  A&Ox3, CN II-XII intact, normal gait Skin:  Warm, no lesions/ rash   Wt Readings from Last 3 Encounters:  07/13/20 222 lb 6.4 oz (100.9 kg)  06/14/20 219 lb 6.4 oz (99.5 kg)  03/09/20 201 lb 2 oz (91.2 kg)    Lab Results  Component Value Date   WBC 8.1 06/14/2020   HGB 12.9 06/14/2020   HCT 40.1 06/14/2020   PLT 260 06/14/2020   GLUCOSE 87 06/14/2020   CHOL 191 06/14/2020   TRIG 133 06/14/2020   HDL 58 06/14/2020   LDLCALC 108 (H) 06/14/2020   NA 138 06/14/2020   K 4.4 06/14/2020   CL 104 06/14/2020   CREATININE 0.72 06/14/2020   BUN 8 06/14/2020   CO2 29 06/14/2020   TSH 0.76 06/14/2020   HGBA1C 5.4 06/14/2020    Assessment/Plan:  Goiter -TSH 1.76 and free T4 1.1 on 06/14/2020 -We will try to obtain labs from OB/GYN visit last week. Patient given records release form. We will also try calling the office. -Further recommendations based on results. Discussed possible uptake and if TSH low versus continued monitoring -Given handout  F/u as needed  Grier Mitts, MD

## 2020-07-24 ENCOUNTER — Other Ambulatory Visit: Payer: 59

## 2020-08-02 ENCOUNTER — Other Ambulatory Visit: Payer: 59

## 2020-08-02 DIAGNOSIS — Z20822 Contact with and (suspected) exposure to covid-19: Secondary | ICD-10-CM

## 2020-08-04 LAB — NOVEL CORONAVIRUS, NAA: SARS-CoV-2, NAA: NOT DETECTED

## 2020-08-04 LAB — SARS-COV-2, NAA 2 DAY TAT

## 2020-08-21 ENCOUNTER — Other Ambulatory Visit: Payer: 59

## 2020-09-06 ENCOUNTER — Other Ambulatory Visit: Payer: 59

## 2020-09-06 DIAGNOSIS — Z20822 Contact with and (suspected) exposure to covid-19: Secondary | ICD-10-CM

## 2020-09-07 LAB — SARS-COV-2, NAA 2 DAY TAT

## 2020-09-07 LAB — NOVEL CORONAVIRUS, NAA: SARS-CoV-2, NAA: NOT DETECTED

## 2020-10-02 ENCOUNTER — Encounter: Payer: Self-pay | Admitting: Family Medicine

## 2020-10-02 ENCOUNTER — Other Ambulatory Visit: Payer: Self-pay

## 2020-10-02 ENCOUNTER — Ambulatory Visit: Payer: 59 | Admitting: Family Medicine

## 2020-10-02 VITALS — BP 102/78 | HR 73 | Temp 97.7°F | Ht 64.0 in | Wt 227.0 lb

## 2020-10-02 DIAGNOSIS — E049 Nontoxic goiter, unspecified: Secondary | ICD-10-CM | POA: Diagnosis not present

## 2020-10-02 DIAGNOSIS — Z7185 Encounter for immunization safety counseling: Secondary | ICD-10-CM | POA: Diagnosis not present

## 2020-10-02 DIAGNOSIS — E0590 Thyrotoxicosis, unspecified without thyrotoxic crisis or storm: Secondary | ICD-10-CM

## 2020-10-02 LAB — TSH: TSH: 0.44 u[IU]/mL (ref 0.35–4.50)

## 2020-10-02 LAB — T4, FREE: Free T4: 0.78 ng/dL (ref 0.60–1.60)

## 2020-10-02 NOTE — Progress Notes (Signed)
Subjective:    Patient ID: Renee Day, female    DOB: [birth year], 50 y.o.   MRN: [id number]  No chief complaint on file.   HPI Patient is a 50 yo female with pmh sig for h/o migraines, h/o asthma, goiter who was seen today for f/u.  Pt had a an abnormal thyroid panel on 07/04/20.  TSH low and free T4 normal.  Pt notes hx of goiter and having her thyroid monitored while out of state.  Pt does not want to be on medicine if possible.  Trying to eat a more antiinflammatory diet. Endorses dry skin, using a thick cream, at times uses vaseline.  Taking really hot, long showers twice a day.  Drinking maybe 32 oz of water per day.  Seen ENT in the past.  Pt inquires about COVID vaccine locations.  Pt had 2 doses of Pfizer COVID vaccine in Feb 2021 and April 2021, with a booster on Jun 11, 2020.  Pt needs a 2nd booster dose as she will be traveling to [place] on March 18th for her husband to run a marathon.  The country is requiring a 2nd booster with in 6 months of travel to the country.  Past Medical History:  Diagnosis Date  . Asthma   . Blood in stool   . Migraine   . Thyroid disease   . UTI (urinary tract infection)     No Known Allergies  ROS General: Denies fever, chills, night sweats, changes in weight, changes in appetite HEENT: Denies headaches, ear pain, changes in vision, rhinorrhea, sore throat  +goiter CV: Denies CP, palpitations, SOB, orthopnea Pulm: Denies SOB, cough, wheezing GI: Denies abdominal pain, nausea, vomiting, diarrhea, constipation GU: Denies dysuria, hematuria, frequency, vaginal discharge Msk: Denies muscle cramps, joint pains Neuro: Denies weakness, numbness, tingling Skin: Denies rashes, bruising  +dry skin Psych: Denies depression, anxiety, hallucinations     Objective:    Blood pressure 102/78, pulse 73, temperature 97.7 F (36.5 C), temperature source Oral, height 5\' 4"  (1.626 m), weight 227 lb (103 kg), last menstrual period 09/05/2020, SpO2  98 %.  Gen. Pleasant, well-nourished, in no distress, normal affect   HEENT: [place]/AT, face symmetric, conjunctiva clear, no scleral icterus, PERRLA, EOMI, nares patent without drainage, pharynx without erythema or exudate. Neck: symmetric thyromegaly Lungs: no accessory muscle use, CTAB, no wheezes or rales Cardiovascular: RRR, no m/r/g, no peripheral edema Abdomen: BS present, soft, NT/ND, no hepatosplenomegaly. Musculoskeletal: No deformities, no cyanosis or clubbing, normal tone Neuro:  A&Ox3, CN II-XII intact, normal gait Skin:  Warm, no lesions/ rash  Wt Readings from Last 3 Encounters:  10/02/20 227 lb (103 kg)  07/13/20 222 lb 6.4 oz (100.9 kg)  06/14/20 219 lb 6.4 oz (99.5 kg)    Lab Results  Component Value Date   WBC 8.1 06/14/2020   HGB 12.9 06/14/2020   HCT 40.1 06/14/2020   PLT 260 06/14/2020   GLUCOSE 87 06/14/2020   CHOL 191 06/14/2020   TRIG 133 06/14/2020   HDL 58 06/14/2020   LDLCALC 108 (H) 06/14/2020   NA 138 06/14/2020   K 4.4 06/14/2020   CL 104 06/14/2020   CREATININE 0.72 06/14/2020   BUN 8 06/14/2020   CO2 29 06/14/2020   TSH 0.76 06/14/2020   HGBA1C 5.4 06/14/2020    Assessment/Plan:  Subclinical hyperthyroidism  -TSH 0.315, free T4 5.7, T3 uptake 27, and free thyroxine index 1.5 -will recheck labs and obtain u/s -given handout -given precautions -consider referral to  Endo - Plan: TSH, T4, free, US THYROID  Goiter  - Plan: US THYROID  Vaccine Counseling -CDC website reviewed regarding travel -discussed area locations offering COVID vaccines  F/u prn  Grier Mitts, MD

## 2020-10-02 NOTE — Patient Instructions (Signed)

## 2020-11-14 ENCOUNTER — Ambulatory Visit: Payer: 59 | Attending: Critical Care Medicine

## 2020-11-14 DIAGNOSIS — Z20822 Contact with and (suspected) exposure to covid-19: Secondary | ICD-10-CM

## 2020-11-15 LAB — SARS-COV-2, NAA 2 DAY TAT

## 2020-11-15 LAB — NOVEL CORONAVIRUS, NAA: SARS-CoV-2, NAA: NOT DETECTED

## 2020-12-03 ENCOUNTER — Encounter (HOSPITAL_BASED_OUTPATIENT_CLINIC_OR_DEPARTMENT_OTHER): Payer: Self-pay | Admitting: Emergency Medicine

## 2020-12-03 ENCOUNTER — Other Ambulatory Visit: Payer: Self-pay

## 2020-12-03 ENCOUNTER — Emergency Department (HOSPITAL_BASED_OUTPATIENT_CLINIC_OR_DEPARTMENT_OTHER)
Admission: EM | Admit: 2020-12-03 | Discharge: 2020-12-03 | Disposition: A | Discharge status: Home / Self Care | Payer: 59 | Attending: Emergency Medicine | Admitting: Emergency Medicine

## 2020-12-03 DIAGNOSIS — J45909 Unspecified asthma, uncomplicated: Secondary | ICD-10-CM | POA: Insufficient documentation

## 2020-12-03 DIAGNOSIS — B0239 Other herpes zoster eye disease: Secondary | ICD-10-CM | POA: Diagnosis not present

## 2020-12-03 DIAGNOSIS — R21 Rash and other nonspecific skin eruption: Secondary | ICD-10-CM | POA: Diagnosis present

## 2020-12-03 MED ORDER — FLUORESCEIN SODIUM 1 MG OP STRP
1.0000 | ORAL_STRIP | Freq: Once | OPHTHALMIC | Status: AC
  Administered 2020-12-03: 1 via OPHTHALMIC
  Filled 2020-12-03: qty 1

## 2020-12-03 MED ORDER — ACYCLOVIR 400 MG PO TABS: 800.0000 mg | ORAL_TABLET | Freq: Every day | ORAL | 0 refills | Status: AC

## 2020-12-03 MED ORDER — TETRACAINE HCL 0.5 % OP SOLN
1.0000 [drp] | Freq: Once | OPHTHALMIC | Status: AC
  Administered 2020-12-03: 1 [drp] via OPHTHALMIC
  Filled 2020-12-03: qty 4

## 2020-12-03 NOTE — Discharge Instructions (Addendum)
You have been seen and discharged from the emergency department.  You have been diagnosed with shingles, we have placed you on an oral antiviral.  Follow-up with your ophthalmologist tomorrow for further eye evaluation.  Take home medications as prescribed. If you have any worsening symptoms, severe left eye pain, vision change/loss, facial droop or further concerns for health please return to an emergency department for further evaluation.

## 2020-12-03 NOTE — ED Triage Notes (Signed)
Pt reports history of shingles to face. Onset Friday started having burning and itching to left eye area, yesterday blister above left eye noted. Pt denies rash anywhere else.

## 2020-12-03 NOTE — ED Provider Notes (Signed)
[facility] Provider Note   CSN: [id number] Arrival date & time: 12/03/20  1041     History Chief Complaint  Patient presents with  . Rash    Above left eye    Renee Day is a 50 y.o. female.  HPI   50 year old female presents the emergency department concern for rash above her left eye.  Patient states since Friday she has had a burning and sharp sensation on her left upper eyelid.  She woke up today she had a cluster of blisters.  She denies any left eye pain, redness, vision change/loss.  No fever.  Denies any ear pain, no facial paralysis.  Has had shingles before on the right forehead.  Past Medical History:  Diagnosis Date  . Asthma   . Blood in stool   . Migraine   . Thyroid disease   . UTI (urinary tract infection)     Patient Active Problem List   Diagnosis Date Noted  . Subclinical hyperthyroidism 10/02/2020  . Goiter 10/02/2020    History reviewed. No pertinent surgical history.   OB History   No obstetric history on file.     Family History  Problem Relation Age of Onset  . Cancer Mother   . Diabetes Mother   . Depression Father   . Diabetes Father   . Arthritis Sister   . Depression Sister   . Arthritis Maternal Grandmother   . Diabetes Maternal Grandmother   . COPD Maternal Grandfather   . Early death Maternal Grandfather   . Diabetes Paternal Grandmother   . Depression Paternal Grandmother   . Arthritis Paternal Grandmother     Social History   Tobacco Use  . Smoking status: Never Smoker  . Smokeless tobacco: Never Used  Vaping Use  . Vaping Use: Never used  Substance Use Topics  . Alcohol use: Not Currently  . Drug use: Never    Home Medications Prior to Admission medications   Medication Sig Start Date End Date Taking? Authorizing Provider  acyclovir (ZOVIRAX) 400 MG tablet Take 2 tablets (800 mg total) by mouth 5 (five) times daily for 7 days. 12/03/20 12/10/20 Yes Lucan Riner, Alvin Critchley, DO   albuterol (VENTOLIN HFA) 108 (90 Base) MCG/ACT inhaler Inhale 2 puffs into the lungs every 6 (six) hours as needed for wheezing or shortness of breath. 03/27/20   Billie Ruddy, MD  prednisoLONE acetate (PRED FORTE) 1 % ophthalmic suspension  02/29/20   [provider]    Allergies    Patient has no known allergies.  Review of Systems   Review of Systems  Constitutional: Negative for chills and fever.  HENT: Negative for congestion and ear pain.   Eyes: Negative for photophobia, pain, discharge, redness, itching and visual disturbance.  Respiratory: Negative for shortness of breath.   Cardiovascular: Negative for chest pain.  Gastrointestinal: Negative for abdominal pain.  Skin: Positive for rash.  Neurological: Negative for facial asymmetry and headaches.    Physical Exam Updated Vital Signs BP 110/82 (BP Location: Right Arm)   Pulse 67   Temp 98.8 F (37.1 C) (Oral)   Resp 16   Ht 5\' 3"  (1.6 m)   Wt 99.8 kg   LMP 11/17/2020   SpO2 100%   BMI 38.97 kg/m   Physical Exam Vitals and nursing note reviewed.  Constitutional:      Appearance: Normal appearance.  HENT:     Head: Normocephalic.     Mouth/Throat:  Mouth: Mucous membranes are moist.  Eyes:     Extraocular Movements: Extraocular movements intact.     Conjunctiva/sclera: Conjunctivae normal.     Pupils: Pupils are equal, round, and reactive to light.     Comments: Left eye pressures are 20, 22.  Eye appears normal, on fluorescein stain there is a small amount of uptake on the temporal aspect, no dendritic pattern.  Visual acuity is equal and 20/15 in both eyes.  She wears glasses, no contacts.  Cardiovascular:     Rate and Rhythm: Normal rate.  Pulmonary:     Effort: Pulmonary effort is normal. No respiratory distress.  Abdominal:     Palpations: Abdomen is soft.     Tenderness: There is no abdominal tenderness.  Skin:    General: Skin is warm.  Neurological:     Mental Status: She is alert  and oriented to person, place, and time. Mental status is at baseline.  Psychiatric:        Mood and Affect: Mood normal.     ED Results / Procedures / Treatments   Labs (all labs ordered are listed, but only abnormal results are displayed) Labs Reviewed - No data to display  EKG None  Radiology No results found.  Procedures Procedures   Medications Ordered in ED Medications  fluorescein ophthalmic strip 1 strip (1 strip Left Eye Given 12/03/20 1114)  tetracaine (PONTOCAINE) 0.5 % ophthalmic solution 1 drop (1 drop Left Eye Given 12/03/20 1114)    ED Course  I have reviewed the triage vital signs and the nursing notes.  Pertinent labs & imaging results that were available during my care of the patient were reviewed by me and considered in my medical decision making (see chart for details).    MDM Rules/Calculators/A&P                          50 year old female presents with what appears to be herpes zoster above the left eye.  The left eye itself looks unremarkable, pressures are okay, there is a small amount of fluorescein uptake in the temporal aspect but no dendritic pattern, visual acuity is equal and normal.  She does not wear contacts.  No facial paralysis.  Discussed with on-call ophthalmologist Dr. Sharen Counter who agrees with oral acyclovir.  He states that we can hold any steroid eyedrops.  She is calling her ophthalmologist and will be evaluated as an outpatient tomorrow.  She is been given strict return to ED instructions and understands.  No need for further emergent testing, admission.  Patient will be discharged and treated as an outpatient.  Discharge plan and strict return to ED precautions discussed, patient verbalizes understanding and agreement.  Final Clinical Impression(s) / ED Diagnoses Final diagnoses:  Herpes zoster of eyelid    Rx / DC Orders ED Discharge Orders         Ordered    acyclovir (ZOVIRAX) 400 MG tablet  5 times daily        12/03/20 1203            Rowena Moilanen, Alvin Critchley, DO 12/03/20 1233

## 2020-12-06 ENCOUNTER — Ambulatory Visit: Payer: 59 | Admitting: Family Medicine

## 2020-12-08 ENCOUNTER — Ambulatory Visit: Payer: 59 | Admitting: Family Medicine

## 2020-12-08 ENCOUNTER — Encounter: Payer: Self-pay | Admitting: Family Medicine

## 2020-12-08 ENCOUNTER — Other Ambulatory Visit: Payer: Self-pay

## 2020-12-08 VITALS — BP 102/72 | HR 82 | Temp 98.4°F | Wt 222.2 lb

## 2020-12-08 DIAGNOSIS — L0100 Impetigo, unspecified: Secondary | ICD-10-CM | POA: Diagnosis not present

## 2020-12-08 DIAGNOSIS — L853 Xerosis cutis: Secondary | ICD-10-CM | POA: Diagnosis not present

## 2020-12-08 DIAGNOSIS — B0239 Other herpes zoster eye disease: Secondary | ICD-10-CM

## 2020-12-08 MED ORDER — FEXOFENADINE HCL 180 MG PO TABS
180.0000 mg | ORAL_TABLET | Freq: Every evening | ORAL | 0 refills | Status: DC | PRN
Start: 2020-12-08 — End: 2021-01-05

## 2020-12-08 MED ORDER — MUPIROCIN CALCIUM 2 % NA OINT: 1.0000 "application " | TOPICAL_OINTMENT | Freq: Two times a day (BID) | NASAL | 0 refills | Status: DC

## 2020-12-08 NOTE — Progress Notes (Signed)
Subjective:    Patient ID: Renee Day, female    DOB: Feb 22, 1971, 50 y.o.   MRN: [id number]  No chief complaint on file.   HPI Patient was seen today for f/u from ED for shingles on L upper eyelid.  Pt notes increased work stress, decreased sleep and not eating properly due to international travel prior to out break starting.  Pt previously had shingles on her forehead.  In ED on 12/03/20, Ophthalmology consulted via phone.  Pt sent home on Acyclovir 800 mg 5x/day x 7 days.  Pt also had f/u with her Ophthalmologist.  Lesions are improving and are not painful nor pruritic.  No issues with vision.  Pt notes face feels dry/pruritic.  Has not been using her regular face wash or moisturizer as did not want to put anything on her face.  Pt also notes a small bump/sore inside nares which she attempted to pop.  Since last OFV pt and her husband travelled to [place] in March so he could run in a marathon.  Past Medical History:  Diagnosis Date  . Asthma   . Blood in stool   . Migraine   . Thyroid disease   . UTI (urinary tract infection)     No Known Allergies  ROS General: Denies fever, chills, night sweats, changes in weight, changes in appetite HEENT: Denies headaches, ear pain, changes in vision, rhinorrhea, sore throat CV: Denies CP, palpitations, SOB, orthopnea Pulm: Denies SOB, cough, wheezing GI: Denies abdominal pain, nausea, vomiting, diarrhea, constipation GU: Denies dysuria, hematuria, frequency, vaginal discharge Msk: Denies muscle cramps, joint pains Neuro: Denies weakness, numbness, tingling Skin: Denies rashes, bruising  +shingles of L eyelid, dry skin, bump in nose Psych: Denies depression, anxiety, hallucinations      Objective:    Blood pressure 102/72, pulse 82, temperature 98.4 F (36.9 C), temperature source Oral, weight 222 lb 3.2 oz (100.8 kg), last menstrual period 11/17/2020, SpO2 94 %.  Gen. Pleasant, well-nourished, in no distress, normal affect    HEENT: [place]/AT, face symmetric, conjunctiva clear, no scleral icterus, PERRLA, EOMI, nares patent without drainage, a small lesion inside of nares with crusting Lungs: no accessory muscle use Cardiovascular: RRR, no peripheral edema Musculoskeletal: No deformities, no cyanosis or clubbing, normal tone Neuro:  A&Ox3, CN II-XII intact, normal gait Skin:  Warm, dry, intact.  Skin of face appears dry.  Small dry area with hyperpigmentation on L cheek.  Medial upper L eyelid with a cluster of dried vesicles, the upper most vesicle appears to have a small amount of clear/yellow fluid in it.  No new lesions noted or lesions on nose.   Wt Readings from Last 3 Encounters:  12/08/20 222 lb 3.2 oz (100.8 kg)  12/03/20 220 lb (99.8 kg)  10/02/20 227 lb (103 kg)    Lab Results  Component Value Date   WBC 8.1 06/14/2020   HGB 12.9 06/14/2020   HCT 40.1 06/14/2020   PLT 260 06/14/2020   GLUCOSE 87 06/14/2020   CHOL 191 06/14/2020   TRIG 133 06/14/2020   HDL 58 06/14/2020   LDLCALC 108 (H) 06/14/2020   NA 138 06/14/2020   K 4.4 06/14/2020   CL 104 06/14/2020   CREATININE 0.72 06/14/2020   BUN 8 06/14/2020   CO2 29 06/14/2020   TSH 0.44 10/02/2020   HGBA1C 5.4 06/14/2020    Assessment/Plan:  Herpes zoster of eyelid -continue Acyclovir 800 mg 5x/day -given precautions -continue f/u with Ophthalmology prn -self care advised to help  decrease stress.  Impetigo  - Plan: mupirocin nasal ointment (BACTROBAN) 2 %  Dry skin dermatitis  -Antihistamine for pruritus -Discussed using a gentle moisturizer and cleanser on face.  Can avoid contact with left eyelid until all lesions have dried. - Plan: fexofenadine (ALLEGRA ALLERGY) 180 MG tablet  F/u prn  Grier Mitts, MD

## 2020-12-08 NOTE — Patient Instructions (Signed)
Treatment of Skin Disease: Comprehensive Therapeutic Strategies (4th ed., pp. 737-106). [employer]. Retrieved from https://www.clinicalkey.com/#!/content/book/3-s2.719 569 4116 X?scrollTo=%23hl0000032">  Impetigo, Adult Impetigo is an infection of the skin. It commonly occurs in young children, but it can also occur in adults. The infection causes itchy blisters and sores that produce brownish-yellow fluid. As the fluid dries, it forms a thick, honey-colored crust. These skin changes usually occur on the face, but they can also affect other areas of the body. Impetigo usually goes away in 7-10 days with treatment. What are the causes? This condition is caused by two types of bacteria. It may be caused by staphylococci or streptococci bacteria. These bacteria cause impetigo when they get under the surface of the skin. This often happens after some damage to the skin, such as:  Cuts, scrapes, or scratches.  Rashes.  Insect bites, especially when you scratch the area of a bite.  Chickenpox or other illnesses that cause open skin sores.  Nail biting or chewing. Impetigo can spread easily from one person to another (is contagious). It may be spread through close skin contact or by sharing towels, clothing, or other items that an infected person has touched. Scratching the affected area can cause impetigo to spread to other parts of the body. The bacteria can get under your fingernails and spread when you touch another area of your skin. What increases the risk? The following factors may make you more likely to develop this condition:  Playing sports that include skin-to-skin contact with others.  Having broken skin, such as from a cut or scrape.  Living in an area that has high humidity levels.  Having poor hygiene.  Having high levels of staphylococci in your nose.  Having a condition that weakens the skin integrity, such as: ? Having a weak body defense system (immune  system). ? Having a skin condition with open sores, such as chickenpox. ? Having diabetes. What are the signs or symptoms? The main symptom of this condition is small blisters, often on the face around the mouth and nose. In time, the blisters break open and turn into tiny sores (lesions) with a yellow crust. In some cases, the blisters cause itching or burning. Scratching, irritation, or lack of treatment may cause these small lesions to get larger. Other possible symptoms include:  Larger blisters.  Pus.  Swollen lymph glands. How is this diagnosed? This condition is usually diagnosed during a physical exam. A skin sample or a sample of fluid from a blister may be taken for lab tests that involve growing bacteria (culture test). Lab tests can help confirm the diagnosis or help determine the best treatment. How is this treated? Treatment for this condition depends on the severity of the condition:  Mild impetigo can be treated with prescription antibiotic cream.  Oral antibiotic medicine may be used in more severe cases.  Medicines that reduce itchiness (antihistamines)may also be used. Follow these instructions at home: Medicines  Take over-the-counter and prescription medicines only as told by your health care provider.  Apply or take your antibiotic as told by your health care provider. Do not stop using the antibiotic even if your condition improves.  Before applying antibiotic cream or ointment, you should: ? Gently wash the infected areas with antibacterial soap and warm water. ? Soak crusted areas in warm, soapy water using antibacterial soap. ? Gently rub the areas to remove crusts. Do not scrub. Preventing the spread of infection  To help prevent impetigo from spreading to other body areas: ?  Keep your fingernails short and clean. ? Do not scratch the blisters or sores. ? Cover infected areas, if necessary, to keep from scratching. ? Wash your hands often with soap  and warm water.  To help prevent impetigo from spreading to other people: ? Do not share towels. ? Wash your clothing and bedsheets in water that is 140F (60C) or warmer. ? Stay home until you have used an antibiotic cream for 48 hours (2 days) or an oral antibiotic medicine for 24 hours (1 day).  You should only return to work and activities with other people if your skin shows significant improvement.  You may return to contact sports after you have used antibiotic medicine for 72 hours (3 days).   General instructions  Keep all follow-up visits. This is important. How is this prevented?  Wash your hands often with soap and warm water.  Do not share towels, washcloths, clothing, bedding, or razors.  Keep your fingernails short.  Keep any cuts, scrapes, bug bites, or rashes clean and covered.  Use insect repellent to prevent bug bites. Contact a health care provider if:  You develop more blisters or sores, even with treatment.  Other family members get sores.  Your skin sores are not improving after 72 hours (3 days) of treatment.  You have a fever. Get help right away if:  You see spreading redness or swelling of the skin around your sores.  You develop a sore throat.  The area around your rash becomes warm, red, or tender to the touch.  You have dark, reddish-brown urine.  You do not urinate often or you urinate small amounts.  You are very tired (lethargic).  You have swelling in your face, hands, or feet. Summary  Impetigo is a skin infection that causes itchy blisters and sores that produce brownish-yellow fluid. As the fluid dries, it forms a crust.  This condition is caused by staphylococci or streptococci bacteria. These bacteria cause impetigo when they get under the surface of the skin, such as through cuts, rashes, bug bites, or open sores.  Treatment for this condition may include antibiotic ointment or oral antibiotics.  To help prevent impetigo  from spreading to other body areas, make sure you keep your fingernails short, avoid scratching, cover any blisters, and wash your hands often.  If you have impetigo, stay home until you have used an antibiotic cream for 48 hours (2 days) or an oral antibiotic medicine for 24 hours (1 day). You should only return to work and activities with other people if your skin shows significant improvement. This information is not intended to replace advice given to you by your health care provider. Make sure you discuss any questions you have with your health care provider. Document Revised: 01/19/2020 Document Reviewed: 01/19/2020 Else[employer] Patient Education  2021 [employer], which is also known as herpes zoster, is an infection that causes a painful skin rash and fluid-filled blisters. It is caused by a virus. Shingles only develops in people who:  Have had chickenpox.  Have been given a medicine to protect against chickenpox (have been vaccinated). Shingles is rare in this group. What are the causes? Shingles is caused by varicella-zoster virus (VZV). This is the same virus that causes chickenpox. After a person is exposed to VZV, the virus stays in the body in an inactive (dormant) state. Shingles develops if the virus is reactivated. This can happen many years after the first (initial) exposure to VZV. It  is not known what causes this virus to be reactivated. What increases the risk? People who have had chickenpox or received the chickenpox vaccine are at risk for shingles. Shingles infection is more common in people who:  Are older than age 64.  Have a weakened disease-fighting system (immune system), such as people with: ? HIV. ? AIDS. ? Cancer.  Are taking medicines that weaken the immune system, such as transplant medicines.  Are experiencing a lot of stress. What are the signs or symptoms? Early symptoms of this condition include itching, tingling, and pain in an  area on your skin. Pain may be described as burning, stabbing, or throbbing. A few days or weeks after early symptoms start, a painful red rash appears. The rash is usually on one side of the body and has a band-like or belt-like pattern. The rash eventually turns into fluid-filled blisters that break open, change into scabs, and dry up in about 2-3 weeks. At any time during the infection, you may also develop:  A fever.  Chills.  A headache.  An upset stomach. How is this diagnosed? This condition is diagnosed with a skin exam. Skin or fluid samples may be taken from the blisters before a diagnosis is made. These samples are examined under a microscope or sent to a lab for testing. How is this treated? The rash may last for several weeks. There is not a specific cure for this condition. Your health care provider will probably prescribe medicines to help you manage pain, recover more quickly, and avoid long-term problems. Medicines may include:  Antiviral drugs.  Anti-inflammatory drugs.  Pain medicines.  Anti-itching medicines (antihistamines). If the area involved is on your face, you may be referred to a specialist, such as an eye doctor (ophthalmologist) or an ear, nose, and throat (ENT) doctor (otolaryngologist) to help you avoid eye problems, chronic pain, or disability. Follow these instructions at home: Medicines  Take over-the-counter and prescription medicines only as told by your health care provider.  Apply an anti-itch cream or numbing cream to the affected area as told by your health care provider. Relieving itching and discomfort  Apply cold, wet cloths (cold compresses) to the area of the rash or blisters as told by your health care provider.  Cool baths can be soothing. Try adding baking soda or dry oatmeal to the water to reduce itching. Do not bathe in hot water.   Blister and rash care  Keep your rash covered with a loose bandage (dressing). Wear loose-fitting  clothing to help ease the pain of material rubbing against the rash.  Keep your rash and blisters clean by washing the area with mild soap and cool water as told by your health care provider.  Check your rash every day for signs of infection. Check for: ? More redness, swelling, or pain. ? Fluid or blood. ? Warmth. ? Pus or a bad smell.  Do not scratch your rash or pick at your blisters. To help avoid scratching: ? Keep your fingernails clean and cut short. ? Wear gloves or mittens while you sleep, if scratching is a problem. General instructions  Rest as told by your health care provider.  Keep all follow-up visits as told by your health care provider. This is important.  Wash your hands often with soap and water. If soap and water are not available, use hand sanitizer. Doing this lowers your chance of getting a bacterial skin infection.  Before your blisters change into scabs, your shingles infection  can cause chickenpox in people who have never had it or have never been vaccinated against it. To prevent this from happening, avoid contact with other people, especially: ? Babies. ? Pregnant women. ? Children who have eczema. ? Elderly people who have transplants. ? People who have chronic illnesses, such as cancer or AIDS. Contact a health care provider if:  Your pain is not relieved with prescribed medicines.  Your pain does not get better after the rash heals.  You have signs of infection in the rash area, such as: ? More redness, swelling, or pain around the rash. ? Fluid or blood coming from the rash. ? The rash area feeling warm to the touch. ? Pus or a bad smell coming from the rash. Get help right away if:  The rash is on your face or nose.  You have facial pain, pain around your eye area, or loss of feeling on one side of your face.  You have difficulty seeing.  You have ear pain or have ringing in your ear.  You have a loss of taste.  Your condition gets  worse. Summary  Shingles, which is also known as herpes zoster, is an infection that causes a painful skin rash and fluid-filled blisters.  This condition is diagnosed with a skin exam. Skin or fluid samples may be taken from the blisters and examined before the diagnosis is made.  Keep your rash covered with a loose bandage (dressing). Wear loose-fitting clothing to help ease the pain of material rubbing against the rash.  Before your blisters change into scabs, your shingles infection can cause chickenpox in people who have never had it or have never been vaccinated against it. This information is not intended to replace advice given to you by your health care provider. Make sure you discuss any questions you have with your health care provider. Document Revised: 12/11/2018 Document Reviewed: 04/23/2017 Elsevier Patient Education  2021 [employer].

## 2020-12-11 ENCOUNTER — Telehealth: Payer: Self-pay | Admitting: Family Medicine

## 2020-12-11 DIAGNOSIS — Z131 Encounter for screening for diabetes mellitus: Secondary | ICD-10-CM

## 2020-12-11 DIAGNOSIS — E0590 Thyrotoxicosis, unspecified without thyrotoxic crisis or storm: Secondary | ICD-10-CM

## 2020-12-11 NOTE — Telephone Encounter (Signed)
Called patient back on CB number, no answer.  Referral has been placed.

## 2020-12-11 NOTE — Telephone Encounter (Signed)
Patient is calling and stated that she was suppose to be referred to see a Endocrinologist for her thyroid but hasn't heard anything yet, please advise. CB is [phone]

## 2020-12-19 NOTE — Telephone Encounter (Signed)
Pt has appointment in June.

## 2021-01-05 ENCOUNTER — Other Ambulatory Visit: Payer: Self-pay | Admitting: Family Medicine

## 2021-01-05 DIAGNOSIS — L853 Xerosis cutis: Secondary | ICD-10-CM

## 2021-01-19 ENCOUNTER — Encounter: Payer: Self-pay | Admitting: Family Medicine

## 2021-01-19 ENCOUNTER — Other Ambulatory Visit: Payer: Self-pay

## 2021-01-19 ENCOUNTER — Ambulatory Visit: Payer: 59 | Admitting: Family Medicine

## 2021-01-19 VITALS — BP 108/78 | HR 69 | Temp 98.4°F | Wt 215.6 lb

## 2021-01-19 DIAGNOSIS — R0602 Shortness of breath: Secondary | ICD-10-CM | POA: Diagnosis not present

## 2021-01-19 DIAGNOSIS — M25571 Pain in right ankle and joints of right foot: Secondary | ICD-10-CM | POA: Diagnosis not present

## 2021-01-19 DIAGNOSIS — R600 Localized edema: Secondary | ICD-10-CM

## 2021-01-19 DIAGNOSIS — I8393 Asymptomatic varicose veins of bilateral lower extremities: Secondary | ICD-10-CM

## 2021-01-19 DIAGNOSIS — M25572 Pain in left ankle and joints of left foot: Secondary | ICD-10-CM

## 2021-01-19 DIAGNOSIS — M79669 Pain in unspecified lower leg: Secondary | ICD-10-CM

## 2021-01-19 LAB — CBC WITH DIFFERENTIAL/PLATELET
Basophils Absolute: 0 10*3/uL (ref 0.0–0.1)
Basophils Relative: 0.4 % (ref 0.0–3.0)
Eosinophils Absolute: 0.1 10*3/uL (ref 0.0–0.7)
Eosinophils Relative: 1 % (ref 0.0–5.0)
HCT: 37.7 % (ref 36.0–46.0)
Hemoglobin: 12.4 g/dL (ref 12.0–15.0)
Lymphocytes Relative: 29.1 % (ref 12.0–46.0)
Lymphs Abs: 2.1 10*3/uL (ref 0.7–4.0)
MCHC: 33 g/dL (ref 30.0–36.0)
MCV: 84.9 fl (ref 78.0–100.0)
Monocytes Absolute: 0.5 10*3/uL (ref 0.1–1.0)
Monocytes Relative: 6.3 % (ref 3.0–12.0)
Neutro Abs: 4.6 10*3/uL (ref 1.4–7.7)
Neutrophils Relative %: 63.2 % (ref 43.0–77.0)
Platelets: 225 10*3/uL (ref 150.0–400.0)
RBC: 4.44 Mil/uL (ref 3.87–5.11)
RDW: 13.2 % (ref 11.5–15.5)
WBC: 7.3 10*3/uL (ref 4.0–10.5)

## 2021-01-19 LAB — COMPREHENSIVE METABOLIC PANEL
ALT: 12 U/L (ref 0–35)
AST: 14 U/L (ref 0–37)
Albumin: 3.6 g/dL (ref 3.5–5.2)
Alkaline Phosphatase: 64 U/L (ref 39–117)
BUN: 12 mg/dL (ref 6–23)
CO2: 23 mEq/L (ref 19–32)
Calcium: 9.1 mg/dL (ref 8.4–10.5)
Chloride: 106 mEq/L (ref 96–112)
Creatinine, Ser: 0.67 mg/dL (ref 0.40–1.20)
GFR: 103.9 mL/min (ref >60.00)
Glucose, Bld: 87 mg/dL (ref 70–99)
Potassium: 3.8 mEq/L (ref 3.5–5.1)
Sodium: 137 mEq/L (ref 135–145)
Total Bilirubin: 0.3 mg/dL (ref 0.2–1.2)
Total Protein: 7.2 g/dL (ref 6.0–8.3)

## 2021-01-19 LAB — TSH: TSH: 0.53 u[IU]/mL (ref 0.35–4.50)

## 2021-01-19 LAB — BRAIN NATRIURETIC PEPTIDE: Pro B Natriuretic peptide (BNP): 21 pg/mL (ref 0.0–100.0)

## 2021-01-19 NOTE — Patient Instructions (Signed)
Shortness of Breath, Adult Shortness of breath is when a person has trouble breathing enough air or when a person feels like she or he is having trouble breathing in enough air. Shortness of breath could be a sign of a medical problem. Follow these instructions at home:  Pay attention to any changes in your symptoms.  Do not use any products that contain nicotine or tobacco, such as cigarettes, e-cigarettes, and chewing tobacco.  Do not smoke. Smoking is a common cause of shortness of breath. If you need help quitting, ask your health care provider.  Avoid things that can irritate your airways, such as: ? Mold. ? Dust. ? Air pollution. ? Chemical fumes. ? Things that can cause allergy symptoms (allergens), if you have allergies.  Keep your living space clean and free of mold and dust.  Rest as needed. Slowly return to your usual activities.  Take over-the-counter and prescription medicines only as told by your health care provider. This includes oxygen therapy and inhaled medicines.  Keep all follow-up visits as told by your health care provider. This is important.   Contact a health care provider if:  Your condition does not improve as soon as expected.  You have a hard time doing your normal activities, even after you rest.  You have new symptoms. Get help right away if:  Your shortness of breath gets worse.  You have shortness of breath when you are resting.  You feel light-headed or you faint.  You have a cough that is not controlled with medicines.  You cough up blood.  You have pain with breathing.  You have pain in your chest, arms, shoulders, or abdomen.  You have a fever.  You cannot walk up stairs or exercise the way that you normally do. These symptoms may represent a serious problem that is an emergency. Do not wait to see if the symptoms will go away. Get medical help right away. Call your local emergency services (911 in the [place] Do not drive yourself  to the hospital. Summary  Shortness of breath is when a person has trouble breathing enough air. It can be a sign of a medical problem.  Avoid things that irritate your lungs, such as smoking, pollution, mold, and dust.  Pay attention to changes in your symptoms and contact your health care provider if you have a hard time completing daily activities because of shortness of breath. This information is not intended to replace advice given to you by your health care provider. Make sure you discuss any questions you have with your health care provider. Document Revised: 01/19/2018 Document Reviewed: 01/19/2018 Elsevier Patient Education  2021 [place].  Ankle Pain The ankle joint holds your body weight and allows you to move around. Ankle pain can occur on either side or the back of one ankle or both ankles. Ankle pain may be sharp and burning or dull and aching. There may be tenderness, stiffness, redness, or warmth around the ankle. Many things can cause ankle pain, including an injury to the area and overuse of the ankle. Follow these instructions at home: Activity  Rest your ankle as told by your health care provider. Avoid any activities that cause ankle pain.  Do not use the injured limb to support your body weight until your health care provider says that you can. Use crutches as told by your health care provider.  Do exercises as told by your health care provider.  Ask your health care provider when it  is safe to drive if you have a brace on your ankle. If you have a brace:  Wear the brace as told by your health care provider. Remove it only as told by your health care provider.  Loosen the brace if your toes tingle, become numb, or turn cold and blue.  Keep the brace clean.  If the brace is not waterproof: ? Do not let it get wet. ? Cover it with a watertight covering when you take a bath or shower. If you were given an elastic bandage:  Remove it when you take a bath  or a shower.  Try not to move your ankle very much, but wiggle your toes from time to time. This helps to prevent swelling.  Adjust the bandage to make it more comfortable if it feels too tight.  Loosen the bandage if you have numbness or tingling in your foot or if your foot turns cold and blue.   Managing pain, stiffness, and swelling  If directed, put ice on the painful area. ? If you have a removable brace or elastic bandage, remove it as told by your health care provider. ? Put ice in a plastic bag. ? Place a towel between your skin and the bag. ? Leave the ice on for 20 minutes, 2-3 times a day.  Move your toes often to avoid stiffness and to lessen swelling.  Raise (elevate) your ankle above the level of your heart while you are sitting or lying down.   General instructions  Record information about your pain. Writing down the following may be helpful for you and your health care provider: ? How often you have ankle pain. ? Where the pain is located. ? What the pain feels like.  If treatment involves wearing a prescribed shoe or insole, make sure you wear it correctly and for as long as told by your health care provider.  Take over-the-counter and prescription medicines only as told by your health care provider.  Keep all follow-up visits as told by your health care provider. This is important. Contact a health care provider if:  Your pain gets worse.  Your pain is not relieved with medicines.  You have a fever or chills.  You are having more trouble with walking.  You have new symptoms. Get help right away if:  Your foot, leg, toes, or ankle: ? Tingles or becomes numb. ? Becomes swollen. ? Turns pale or blue. Summary  Ankle pain can occur on either side or the back of one ankle or both ankles.  Ankle pain may be sharp and burning or dull and aching.  Rest your ankle as told by your health care provider. If told, apply ice to the area.  Take  over-the-counter and prescription medicines only as told by your health care provider. This information is not intended to replace advice given to you by your health care provider. Make sure you discuss any questions you have with your health care provider. Document Revised: 12/08/2018 Document Reviewed: 02/25/2018 Elsevier Patient Education  2021 [place].  Peripheral Edema  Peripheral edema is swelling that is caused by a buildup of fluid. Peripheral edema most often affects the lower legs, ankles, and feet. It can also develop in the arms, hands, and face. The area of the body that has peripheral edema will look swollen. It may also feel heavy or warm. Your clothes may start to feel tight. Pressing on the area may make a temporary dent in your skin. You  may not be able to move your swollen arm or leg as much as usual. There are many causes of peripheral edema. It can happen because of a complication of other conditions such as congestive heart failure, kidney disease, or a problem with your blood circulation. It also can be a side effect of certain medicines or because of an infection. It often happens to women during pregnancy. Sometimes, the cause is not known. Follow these instructions at home: Managing pain, stiffness, and swelling  Raise (elevate) your legs while you are sitting or lying down.  Move around often to prevent stiffness and to lessen swelling.  Do not sit or stand for long periods of time.  Wear support stockings as told by your health care provider.   Medicines  Take over-the-counter and prescription medicines only as told by your health care provider.  Your health care provider may prescribe medicine to help your body get rid of excess water (diuretic). General instructions  Pay attention to any changes in your symptoms.  Follow instructions from your health care provider about limiting salt (sodium) in your diet. Sometimes, eating less salt may reduce  swelling.  Moisturize skin daily to help prevent skin from cracking and draining.  Keep all follow-up visits as told by your health care provider. This is important. Contact a health care provider if you have:  A fever.  Edema that starts suddenly or is getting worse, especially if you are pregnant or have a medical condition.  Swelling in only one leg.  Increased swelling, redness, or pain in one or both of your legs.  Drainage or sores at the area where you have edema. Get help right away if you:  Develop shortness of breath, especially when you are lying down.  Have pain in your chest or abdomen.  Feel weak.  Feel faint. Summary  Peripheral edema is swelling that is caused by a buildup of fluid. Peripheral edema most often affects the lower legs, ankles, and feet.  Move around often to prevent stiffness and to lessen swelling. Do not sit or stand for long periods of time.  Pay attention to any changes in your symptoms.  Contact a health care provider if you have edema that starts suddenly or is getting worse, especially if you are pregnant or have a medical condition.  Get help right away if you develop shortness of breath, especially when lying down. This information is not intended to replace advice given to you by your health care provider. Make sure you discuss any questions you have with your health care provider. Document Revised: 05/13/2018 Document Reviewed: 05/13/2018 Elsevier Patient Education  2021 [place].  Varicose Veins Varicose veins are veins that have become enlarged, bulged, and twisted. They most often appear in the legs. What are the causes? This condition is caused by damage to the valves in the vein. These valves help blood return to your heart. When they are damaged and they stop working properly, blood may flow backward and back up in the veins near the skin, causing the veins to get larger and appear twisted. The condition can result from  any issue that causes blood to back up, like pregnancy, prolonged standing, or obesity. What increases the risk? This condition is more likely to develop in people who are:  On their feet a lot.  Pregnant.  Overweight. What are the signs or symptoms? Symptoms of this condition include:  Bulging, twisted, and bluish veins.  A feeling of heaviness. This may  be worse at the end of the day.  Leg pain. This may be worse at the end of the day.  Swelling in the leg.  Changes in skin color over the veins. How is this diagnosed? This condition may be diagnosed based on your symptoms, a physical exam, and an ultrasound test. How is this treated? Treatment for this condition may involve:  Avoiding sitting or standing in one position for long periods of time.  Wearing compression stockings. These stockings help to prevent blood clots and reduce swelling in the legs.  Raising (elevating) the legs when resting.  Losing weight.  Exercising regularly. If you have persistent symptoms or want to improve the way your varicose veins look, you may choose to have a procedure to close the varicose veins off or to remove them. Treatments to close off the veins include:  Sclerotherapy. In this treatment, a solution is injected into a vein to close it off.  Laser treatment. In this treatment, the vein is heated with a laser to close it off.  Radiofrequency vein ablation. In this treatment, an electrical current produced by radio waves is used to close off the vein. Treatments to remove the veins include:  Phlebectomy. In this treatment, the veins are removed through small incisions made over the veins.  Vein ligation and stripping. In this treatment, incisions are made over the veins. The veins are then removed after being tied (ligated) with stitches (sutures). Follow these instructions at home: Activity  Walk as much as possible. Walking increases blood flow. This helps blood return to the  heart and takes pressure off your veins. It also increases your cardiovascular strength.  Follow your health care provider's instructions about exercising.  Do not stand or sit in one position for a long period of time.  Do not sit with your legs crossed.  Rest with your legs raised during the day. General instructions  Follow any diet instructions given to you by your health care provider.  Wear compression stockings as directed by your health care provider. Do not wear other kinds of tight clothing around your legs, pelvis, or waist.  Elevate your legs at night to above the level of your heart.  If you get a cut in the skin over the varicose vein and the vein bleeds: ? Lie down with your leg raised. ? Apply firm pressure to the cut with a clean cloth until the bleeding stops. ? Place a bandage (dressing) on the cut.   Contact a health care provider if:  The skin around your varicose veins starts to break down.  You have pain, redness, tenderness, or hard swelling over a vein.  You are uncomfortable because of pain.  You get a cut in the skin over a varicose vein and it will not stop bleeding. Summary  Varicose veins are veins that have become enlarged, bulged, and twisted. They most often appear in the legs.  This condition is caused by damage to the valves in the vein. These valves help blood return to your heart.  Treatment for this condition includes frequent movements, wearing compression stockings, losing weight, and exercising regularly. In some cases, procedures are done to close off or remove the veins.  Treatment for this condition may include wearing compression stockings, elevating the legs, losing weight, and engaging in regular activity. In some cases, procedures are done to close off or remove the veins. This information is not intended to replace advice given to you by your health care provider.  Make sure you discuss any questions you have with your health care  provider. Document Revised: 12/30/2019 Document Reviewed: 12/30/2019 Elsevier Patient Education  [place].

## 2021-01-19 NOTE — Progress Notes (Signed)
Patient viewed results on MyChart. 

## 2021-01-19 NOTE — Progress Notes (Signed)
Subjective:    Patient ID: Renee Day, female    DOB: May 29, 1971, 50 y.o.   MRN: [id number]  Chief Complaint  Patient presents with  . Joint Swelling    States both, but left more than right going on a week, has started walking more, stretching and drinking more water. Has not taking anything, just using compression socks.    HPI Patient was seen today for acute concerns as mentioned above.  Patient endorses bilateral pain and edema x2 weeks.  L ankle>R.  Edema noted more in afternoon, but has also been present when waking up first thing in the morning.  Notes increased sitting while working on a project for work.  Patient has decreasing sodium intake, no longer eating bologna.  Walking more, wearing supportive walking shoes, and stretching.  Patient with a history of varicose veins status post sclerotherapy.  Wearing compression socks which have helped some.  Patient endorses some SOB/chest discomfort, unsure of 2/2 history of asthma and allergies.   Past Medical History:  Diagnosis Date  . Asthma   . Blood in stool   . Migraine   . Thyroid disease   . UTI (urinary tract infection)     No Known Allergies  ROS General: Denies fever, chills, night sweats, changes in weight, changes in appetite HEENT: Denies headaches, ear pain, changes in vision, rhinorrhea, sore throat CV: Denies CP, palpitations, SOB, orthopnea + bilateral ankle edema, SOB, h/o varicose veins. Pulm: Denies SOB, cough, wheezing + SOB GI: Denies abdominal pain, nausea, vomiting, diarrhea, constipation GU: Denies dysuria, hematuria, frequency, vaginal discharge Msk: Denies muscle cramps, joint pains +b/l ankle pain Neuro: Denies weakness, numbness, tingling Skin: Denies rashes, bruising Psych: Denies depression, anxiety, hallucinations    Objective:    Blood pressure 108/78, pulse 69, temperature 98.4 F (36.9 C), temperature source Oral, weight 215 lb 9.6 oz (97.8 kg), SpO2 97 %.  Gen. Pleasant,  well-nourished, in no distress, normal affect   HEENT: [place]/AT, face symmetric, conjunctiva clear, no scleral icterus, PERRLA, EOMI, nares patent without drainage Lungs: no accessory muscle use, CTAB, no wheezes or rales Cardiovascular: RRR, no m/r/g, no peripheral edema Musculoskeletal: TTP of bilateral ankles and calves.  Left lateral calf firm.  Dorsiflexion of bilateral feet causes discomfort in ankles.  No deformities, no cyanosis or clubbing, normal tone Neuro:  A&Ox3, CN II-XII intact, normal gait Skin:  Warm, no lesions/ rash   Wt Readings from Last 3 Encounters:  01/19/21 215 lb 9.6 oz (97.8 kg)  12/08/20 222 lb 3.2 oz (100.8 kg)  12/03/20 220 lb (99.8 kg)    Lab Results  Component Value Date   WBC 8.1 06/14/2020   HGB 12.9 06/14/2020   HCT 40.1 06/14/2020   PLT 260 06/14/2020   GLUCOSE 87 06/14/2020   CHOL 191 06/14/2020   TRIG 133 06/14/2020   HDL 58 06/14/2020   LDLCALC 108 (H) 06/14/2020   NA 138 06/14/2020   K 4.4 06/14/2020   CL 104 06/14/2020   CREATININE 0.72 06/14/2020   BUN 8 06/14/2020   CO2 29 06/14/2020   TSH 0.44 10/02/2020   HGBA1C 5.4 06/14/2020    Assessment/Plan:  Acute bilateral ankle pain  -likely 2/2 edema.  Also consider autoimmune cause. -will obtain labs -continue supportive care. - Plan: ANA,IFA RA Diag Pnl w/rflx Tit/Patn, CBC with Differential/Platelet  Bilateral leg edema  -likely 2/2 venous insufficiency given h/o varicse veins.  -will obtain labs and imaging to r/o DVT -Care including TED hose, compression  socks, elevation -Given strict precautions - Plan: VAS [place] LOWER EXTREMITY VENOUS (DVT), Brain Natriuretic Peptide, Comprehensive metabolic panel, D-dimer, Quantitative, TSH  Calf tenderness  -possibly 2/2 edema but must r/o DVT -given strict precautions - Plan: VAS [place] LOWER EXTREMITY VENOUS (DVT), D-dimer, Quantitative  SOB (shortness of breath)  -consider asthma and allergies -continue OTC Allegra and albuterol  prn -consider flonase for continued symptoms - Plan: Brain Natriuretic Peptide, D-dimer, Quantitative  Varicose veins of both lower extremities, unspecified whether complicated -dicussed supportive measures including TED hose, elevation -for continued pain discussed referral to Vascular  F/u as needed  Grier Mitts, MD

## 2021-01-23 ENCOUNTER — Ambulatory Visit (HOSPITAL_COMMUNITY)
Admission: RE | Admit: 2021-01-23 | Discharge: 2021-01-23 | Disposition: A | Discharge status: Home / Self Care | Payer: 59 | Source: Ambulatory Visit | Attending: Cardiovascular Disease | Admitting: Cardiovascular Disease

## 2021-01-23 ENCOUNTER — Other Ambulatory Visit: Payer: Self-pay

## 2021-01-23 DIAGNOSIS — M79669 Pain in unspecified lower leg: Secondary | ICD-10-CM

## 2021-01-23 DIAGNOSIS — R600 Localized edema: Secondary | ICD-10-CM

## 2021-01-23 LAB — ANA,IFA RA DIAG PNL W/RFLX TIT/PATN
Anti Nuclear Antibody (ANA): NEGATIVE
Cyclic Citrullin Peptide Ab: <16 UNITS
Rheumatoid fact SerPl-aCnc: <14 IU/mL (ref <14)

## 2021-01-23 LAB — D-DIMER, QUANTITATIVE: D-Dimer, Quant: 0.37 mcg/mL FEU (ref <0.50)

## 2021-02-19 ENCOUNTER — Ambulatory Visit
Admission: RE | Admit: 2021-02-19 | Discharge: 2021-02-19 | Disposition: A | Discharge status: Home / Self Care | Payer: 59 | Source: Ambulatory Visit | Attending: Sports Medicine | Admitting: Sports Medicine

## 2021-02-19 ENCOUNTER — Ambulatory Visit: Payer: 59 | Admitting: Sports Medicine

## 2021-02-19 ENCOUNTER — Other Ambulatory Visit: Payer: Self-pay

## 2021-02-19 ENCOUNTER — Encounter: Payer: Self-pay | Admitting: Sports Medicine

## 2021-02-19 VITALS — BP 100/70 | Ht 63.0 in | Wt 220.0 lb

## 2021-02-19 DIAGNOSIS — M25572 Pain in left ankle and joints of left foot: Secondary | ICD-10-CM | POA: Diagnosis not present

## 2021-02-19 DIAGNOSIS — M7661 Achilles tendinitis, right leg: Secondary | ICD-10-CM | POA: Diagnosis not present

## 2021-02-19 DIAGNOSIS — M25571 Pain in right ankle and joints of right foot: Secondary | ICD-10-CM | POA: Insufficient documentation

## 2021-02-19 NOTE — Patient Instructions (Signed)
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  However, I will obtain an x-ray to further evaluate and rule out other causes.  In the meantime I would like you to wear shoe inserts to help correct this.  You can use Aleve as needed for pain and inflammation.  You can also use Voltaren gel which is a topical form of Aleve.  I would like to see you back in 4 weeks to reevaluate.  For your Achilles pain

## 2021-02-19 NOTE — Assessment & Plan Note (Addendum)
Acute in setting of increased activity and chronic pronated gait. No inciting trauma. Differential includes sinus tarsi syndrome vs perineal tendinopathy. Will obtain x-rays to rule out osteoarthritis. Recommended ankle support, activity as tolerated, NSAIDs PRN, and follow up in 3 weeks for continued observation. Will follow up x-rays and further recommendation pending results.

## 2021-02-19 NOTE — Progress Notes (Addendum)
Subjective:   Patient ID: Renee Day    DOB: 06-Jan-1971, 50 y.o. female   MRN: [id number]  Renee Day is a 50 y.o. female with a history of goiter and subclinical hyperthyroidism here for left ankle pain  Patient presents with lateral and posterior left ankle pain x1 month.  She saw her PCP about 1 month ago due to this pain and some swelling that extended into her calf.  She had a DVT study that was negative.  She has a history of varicose vein surgery years ago.  Denies any injuries to her ankle however she has been training for a 10K walk/run marathon that started about a month ago.  She did walk long distances prior to this but has begun to walk at a faster pace for this new marathon training.  Has treated the pain with occasional Aleve which helps with the swelling and pain temporarily.  Has not had any imaging.  Review of Systems:  Per HPI.   Objective:   BP 100/70   Ht 5\' 3"  (1.6 m)   Wt 220 lb (99.8 kg)   BMI 38.97 kg/m  Vitals and nursing note reviewed.  General: pleasant older woman, sitting comfortably on exam bed, well nourished, well developed, in no acute distress with non-toxic appearance Resp: breathing comfortably on room air, speaking in full sentences Skin: warm, dry, Extremities: warm and well perfused MSK: Pronated gait  Right Foot: Inspection:  No obvious bony deformity. Mild swelling around ankle joint, No erythema, or bruising.  Normal arch Palpation: Tender to palpation along dorsal lateral right ankle anterior inferior to lateral malleolus, pain to palpation of right achilles and achilles fat fat ROM: Full  ROM of the ankle. Normal midfoot flexibility Strength: 5/5 strength ankle in all planes Neurovascular: N/V intact distally in the lower extremity Special tests: Negative anterior drawer. Negative squeeze. normal midfoot flexibility. Normal calcaneal motion with heel raise  Left Foot: Inspection:  No obvious bony deformity.  No  swelling, erythema, or bruising.  Normal arch Palpation: No tenderness to palpation ROM: Full  ROM of the ankle. Normal midfoot flexibility Strength: 5/5 strength ankle in all planes Neurovascular: N/V intact distally in the lower extremity Special tests: Negative anterior drawer. Negative squeeze. normal midfoot flexibility. Normal calcaneal motion with heel raise  Neuro: Alert and oriented, speech normal  Assessment & Plan:   Acute right ankle pain Acute in setting of increased activity and chronic pronated gait. No inciting trauma. Differential includes sinus tarsi syndrome vs perineal tendinopathy. Will obtain x-rays to rule out osteoarthritis. Recommended ankle support, activity as tolerated, NSAIDs PRN, and follow up in 3 weeks for continued observation. Will follow up x-rays and further recommendation pending results.   Orders Placed This Encounter  Procedures   DG Ankle Complete Left    Standing Status:   Future    Number of Occurrences:   1    Standing Expiration Date:   02/19/2022    Order Specific Question:   Reason for Exam (SYMPTOM  OR DIAGNOSIS REQUIRED)    Answer:   left ankle pain    Order Specific Question:   Is patient pregnant?    Answer:   No    Order Specific Question:   Preferred imaging location?    Answer:   [facility]   No orders of the defined types were placed in this encounter.     Mina Marble, DO PGY-3, [facility] 02/19/2021 12:15 PM  Patient seen and evaluated with the resident.  I agree with the above plan of care.  X-rays to rule out ankle osteoarthritis.  We will treat with a compression sleeve as well as green sports insoles with scaphoid pads.  She may continue with over-the-counter NSAIDs as well.  Follow-up in 3 weeks for reevaluation.  Her race is in September.  She will be notified of x-ray results when available.  Addendum: X-rays reviewed.  No evidence of osteoarthritis.  Incidental finding of a calcaneal  spur.

## 2021-02-23 ENCOUNTER — Ambulatory Visit: Payer: 59 | Admitting: Endocrinology

## 2021-02-23 ENCOUNTER — Encounter: Payer: Self-pay | Admitting: Endocrinology

## 2021-02-23 ENCOUNTER — Other Ambulatory Visit: Payer: Self-pay

## 2021-02-23 VITALS — BP 118/84 | HR 70 | Ht 63.0 in | Wt 222.4 lb

## 2021-02-23 DIAGNOSIS — E042 Nontoxic multinodular goiter: Secondary | ICD-10-CM | POA: Diagnosis not present

## 2021-02-23 DIAGNOSIS — E119 Type 2 diabetes mellitus without complications: Secondary | ICD-10-CM | POA: Diagnosis not present

## 2021-02-23 LAB — POCT GLYCOSYLATED HEMOGLOBIN (HGB A1C): Hemoglobin A1C: 5.4 % (ref 4.0–5.6)

## 2021-02-23 NOTE — Patient Instructions (Signed)
Let's recheck the ultrasound.  you will receive a phone call, about a day and time for an appointment.  After that, I would be happy to remove some of the fluid again.  most of the time, a "lumpy thyroid" will eventually become more overactive.  this is usually a slow process, happening over the span of many years.  Please come back for a follow-up appointment in 6 months.

## 2021-02-23 NOTE — Progress Notes (Signed)
Subjective:    Patient ID: Renee Day, female    DOB: [birth year], 50 y.o.   MRN: [id number]  HPI Pt is referred by Dr Volanda Napoleon, for hyperthyroidism/MNG.  Pt reports he was dx'ed with MNG in 2015, and hyperthyroidism in 2021.  she has never been on therapy for this.  she has never had XRT to the anterior neck, or thyroid surgery.  she does not consume kelp or any other non-prescribed thyroid medication.  she has never been on amiodarone.  She has weight gain.  She says the mass effect of the thyroid is bothering her. Past Medical History:  Diagnosis Date   Asthma    Blood in stool    Migraine    Thyroid disease    UTI (urinary tract infection)     No past surgical history on file.  Social History   Socioeconomic History   Marital status: Married    Spouse name: Not on file   Number of children: Not on file   Years of education: Not on file   Highest education level: Not on file  Occupational History   Not on file  Tobacco Use   Smoking status: Never   Smokeless tobacco: Never  Vaping Use   Vaping Use: Never used  Substance and Sexual Activity   Alcohol use: Not Currently   Drug use: Never   Sexual activity: Yes  Other Topics Concern   Not on file  Social History Narrative   Not on file   Social Determinants of Health   Financial Resource Strain: Not on file  Food Insecurity: Not on file  Transportation Needs: Not on file  Physical Activity: Not on file  Stress: Not on file  Social Connections: Not on file  Intimate Partner Violence: Not on file    Current Outpatient Medications on File Prior to Visit  Medication Sig Dispense Refill   albuterol (VENTOLIN HFA) 108 (90 Base) MCG/ACT inhaler Inhale 2 puffs into the lungs every 6 (six) hours as needed for wheezing or shortness of breath. 8 g 3   fexofenadine (ALLEGRA) 180 MG tablet TAKE 1 TABLET BY MOUTH AT BEDTIME AS NEEDED FOR ITCHING. 30 tablet 0   mupirocin nasal ointment (BACTROBAN) 2 % Place 1  application into the nose 2 (two) times daily for 5 days. 10 g 0   No current facility-administered medications on file prior to visit.    No Known Allergies  Family History  Problem Relation Age of Onset   Cancer Mother    Diabetes Mother    Depression Father    Diabetes Father    Arthritis Sister    Depression Sister    Arthritis Maternal Grandmother    Diabetes Maternal Grandmother    COPD Maternal Grandfather    Early death Maternal Grandfather    Diabetes Paternal Grandmother    Depression Paternal Grandmother    Arthritis Paternal Grandmother    Thyroid disease Neg Hx     BP 118/84 (BP Location: Right Arm, Patient Position: Sitting, Cuff Size: Large)   Pulse 70   Ht 5\' 3"  (1.6 m)   Wt 222 lb 6.4 oz (100.9 kg)   SpO2 97%   BMI 39.40 kg/m    Review of Systems denies palpitations, excessive diaphoresis, tremor, anxiety, and heat intolerance.      Objective:   Physical Exam NECK: the entire right lobe is 5 times normal size.  There is a 4 cm left thyroid nodule.     Lab  Results  Component Value Date   HGBA1C 5.4 02/23/2021     Lab Results  Component Value Date   TSH 0.53 01/19/2021   outside test results are reviewed: TSH=0.31 (2021) Bilat cytol (2016): adenomatoid nodule with cystic changes  [place] (2016): 2 right, and 1 left nodule.  All are complex.  I have reviewed outside records, and summarized:  Pt was noted to have MNG, and referred here.  She was dx'ed when she lived in [place], and was followed with serial [place].       Assessment & Plan:  MNG, due for recheck.   Hyperthyroidism, mild.  This is typical of MNG.  If [place] is suspicious, she may need aspiration, followed by nuc med scan.    Patient Instructions  Let's recheck the ultrasound.  you will receive a phone call, about a day and time for an appointment.  After that, I would be happy to remove some of the fluid again.  most of the time, a "lumpy thyroid" will eventually become more overactive.   this is usually a slow process, happening over the span of many years.  Please come back for a follow-up appointment in 6 months.

## 2021-02-28 ENCOUNTER — Ambulatory Visit
Admission: RE | Admit: 2021-02-28 | Discharge: 2021-02-28 | Disposition: A | Discharge status: Home / Self Care | Payer: 59 | Source: Ambulatory Visit | Attending: Endocrinology | Admitting: Endocrinology

## 2021-02-28 DIAGNOSIS — E042 Nontoxic multinodular goiter: Secondary | ICD-10-CM

## 2021-03-08 ENCOUNTER — Ambulatory Visit: Payer: 59 | Admitting: Family Medicine

## 2021-03-08 ENCOUNTER — Other Ambulatory Visit: Payer: Self-pay

## 2021-03-08 ENCOUNTER — Encounter: Payer: Self-pay | Admitting: Family Medicine

## 2021-03-08 VITALS — BP 118/84 | HR 72 | Temp 98.3°F | Wt 221.4 lb

## 2021-03-08 DIAGNOSIS — G4482 Headache associated with sexual activity: Secondary | ICD-10-CM | POA: Diagnosis not present

## 2021-03-08 DIAGNOSIS — L0100 Impetigo, unspecified: Secondary | ICD-10-CM

## 2021-03-08 MED ORDER — MUPIROCIN CALCIUM 2 % EX CREA: 1.0000 "application " | TOPICAL_CREAM | Freq: Two times a day (BID) | CUTANEOUS | 0 refills | Status: DC

## 2021-03-08 NOTE — Patient Instructions (Signed)
In order for a CT of the blood vessels in her head has been ordered.  You should expect a call about scheduling this appointment.  For any worsening headache/head/eye pain or loss of vision proceed to nearest emergency department for further evaluation

## 2021-03-08 NOTE — Progress Notes (Signed)
Subjective:    Patient ID: Renee Day, female    DOB: 1970/12/31, 50 y.o.   MRN: [id number]  Chief Complaint  Patient presents with   Rash   Pain    HPI Patient was seen today for acute concern.  Pt with rash in gluteal cleft x 3 days.  Bumps are pruritic and initially appeared as a cluster of vesicles.  HAs picture on phone of a cluster of pustules.  Pt was unsure if they were 2/2 another shingles outbreak.  Pt had shingles on L side of forehead/eyelid 12/08/20.  Pt also notes h/o "zingers" in eyes and head.  Seen by ophthalmology.  Pt notes recent intense post coital HA.  Pt notes her great aunt had an aneurysm.    Past Medical History:  Diagnosis Date   Asthma    Blood in stool    Migraine    Thyroid disease    UTI (urinary tract infection)    Family History  Problem Relation Age of Onset   Cancer Mother    Diabetes Mother    Depression Father    Diabetes Father    Arthritis Sister    Depression Sister    Arthritis Maternal Grandmother    Diabetes Maternal Grandmother    COPD Maternal Grandfather    Early death Maternal Grandfather    Diabetes Paternal Grandmother    Depression Paternal Grandmother    Arthritis Paternal Grandmother    Thyroid disease Neg Hx     No Known Allergies  ROS General: Denies fever, chills, night sweats, changes in weight, changes in appetite HEENT: Denies headaches, ear pain, changes in vision, rhinorrhea, sore throat   +post coital HA CV: Denies CP, palpitations, SOB, orthopnea Pulm: Denies SOB, cough, wheezing GI: Denies abdominal pain, nausea, vomiting, diarrhea, constipation GU: Denies dysuria, hematuria, frequency, vaginal discharge Msk: Denies muscle cramps, joint pains Neuro: Denies weakness, numbness, tingling Skin: Denies rashes, bruising  + rash  Psych: Denies depression, anxiety, hallucinations    Objective:    Blood pressure 118/84, pulse 72, temperature 98.3 F (36.8 C), temperature source Oral, weight 221 lb 6.4  oz (100.4 kg), SpO2 97 %.  Gen. Pleasant, well-nourished, in no distress, normal affect   HEENT: [place]/AT, wearing glasses, face symmetric, conjunctiva clear, no scleral icterus, PERRLA, EOMI, nares patent without drainage Lungs: no accessory muscle use Cardiovascular: RRR, no peripheral edema Musculoskeletal: No deformities, no cyanosis or clubbing, normal tone Neuro:  A&Ox3, CN II-XII intact, normal gait Skin:  Warm, dry.  L upper gluteal cleft with area of ulcerated skin in a cluster.   Wt Readings from Last 3 Encounters:  03/08/21 221 lb 6.4 oz (100.4 kg)  02/23/21 222 lb 6.4 oz (100.9 kg)  02/19/21 220 lb (99.8 kg)    Lab Results  Component Value Date   WBC 7.3 01/19/2021   HGB 12.4 01/19/2021   HCT 37.7 01/19/2021   PLT 225.0 01/19/2021   GLUCOSE 87 01/19/2021   CHOL 191 06/14/2020   TRIG 133 06/14/2020   HDL 58 06/14/2020   LDLCALC 108 (H) 06/14/2020   ALT 12 01/19/2021   AST 14 01/19/2021   NA 137 01/19/2021   K 3.8 01/19/2021   CL 106 01/19/2021   CREATININE 0.67 01/19/2021   BUN 12 01/19/2021   CO2 23 01/19/2021   TSH 0.53 01/19/2021   HGBA1C 5.4 02/23/2021    Assessment/Plan:  Headache associated with sexual activity  - Plan: CT ANGIO HEAD W OR WO CONTRAST  Impetigo  -  Keep skin clean and dry - Plan: mupirocin cream (BACTROBAN) 2 %  F/u prn  Grier Mitts, MD

## 2021-03-12 ENCOUNTER — Ambulatory Visit (INDEPENDENT_AMBULATORY_CARE_PROVIDER_SITE_OTHER)
Admission: RE | Admit: 2021-03-12 | Discharge: 2021-03-12 | Disposition: A | Discharge status: Home / Self Care | Payer: 59 | Source: Ambulatory Visit | Attending: Family Medicine | Admitting: Family Medicine

## 2021-03-12 ENCOUNTER — Other Ambulatory Visit: Payer: Self-pay

## 2021-03-12 DIAGNOSIS — G4482 Headache associated with sexual activity: Secondary | ICD-10-CM | POA: Diagnosis not present

## 2021-03-12 MED ORDER — IOHEXOL 350 MG/ML SOLN
75.0000 mL | Freq: Once | INTRAVENOUS | Status: AC | PRN
  Administered 2021-03-12: 75 mL via INTRAVENOUS

## 2021-05-29 ENCOUNTER — Other Ambulatory Visit: Payer: Self-pay | Admitting: Family Medicine

## 2021-05-29 DIAGNOSIS — L853 Xerosis cutis: Secondary | ICD-10-CM

## 2021-06-04 ENCOUNTER — Ambulatory Visit: Payer: 59

## 2021-06-14 ENCOUNTER — Ambulatory Visit: Payer: 59

## 2021-06-14 ENCOUNTER — Other Ambulatory Visit: Payer: Self-pay

## 2021-06-15 ENCOUNTER — Other Ambulatory Visit: Payer: Self-pay | Admitting: Family Medicine

## 2021-06-15 ENCOUNTER — Ambulatory Visit (INDEPENDENT_AMBULATORY_CARE_PROVIDER_SITE_OTHER): Payer: 59 | Admitting: Family Medicine

## 2021-06-15 ENCOUNTER — Encounter: Payer: Self-pay | Admitting: Family Medicine

## 2021-06-15 VITALS — BP 112/84 | HR 72 | Temp 97.8°F | Ht 63.0 in | Wt 214.2 lb

## 2021-06-15 DIAGNOSIS — Z13220 Encounter for screening for lipoid disorders: Secondary | ICD-10-CM

## 2021-06-15 DIAGNOSIS — E538 Deficiency of other specified B group vitamins: Secondary | ICD-10-CM | POA: Insufficient documentation

## 2021-06-15 DIAGNOSIS — Z0000 Encounter for general adult medical examination without abnormal findings: Secondary | ICD-10-CM | POA: Diagnosis not present

## 2021-06-15 DIAGNOSIS — R5383 Other fatigue: Secondary | ICD-10-CM | POA: Diagnosis not present

## 2021-06-15 DIAGNOSIS — E042 Nontoxic multinodular goiter: Secondary | ICD-10-CM | POA: Diagnosis not present

## 2021-06-15 DIAGNOSIS — M5432 Sciatica, left side: Secondary | ICD-10-CM

## 2021-06-15 DIAGNOSIS — E559 Vitamin D deficiency, unspecified: Secondary | ICD-10-CM | POA: Insufficient documentation

## 2021-06-15 DIAGNOSIS — Z23 Encounter for immunization: Secondary | ICD-10-CM

## 2021-06-15 LAB — T4, FREE: Free T4: 0.91 ng/dL (ref 0.60–1.60)

## 2021-06-15 LAB — BASIC METABOLIC PANEL
BUN: 7 mg/dL (ref 6–23)
CO2: 28 mEq/L (ref 19–32)
Calcium: 9.6 mg/dL (ref 8.4–10.5)
Chloride: 102 mEq/L (ref 96–112)
Creatinine, Ser: 0.73 mg/dL (ref 0.40–1.20)
GFR: 97.48 mL/min (ref >60.00)
Glucose, Bld: 89 mg/dL (ref 70–99)
Potassium: 3.8 mEq/L (ref 3.5–5.1)
Sodium: 137 mEq/L (ref 135–145)

## 2021-06-15 LAB — HEMOGLOBIN A1C: Hgb A1c MFr Bld: 5.5 % (ref 4.6–6.5)

## 2021-06-15 LAB — CBC WITH DIFFERENTIAL/PLATELET
Basophils Absolute: 0 10*3/uL (ref 0.0–0.1)
Basophils Relative: 0.6 % (ref 0.0–3.0)
Eosinophils Absolute: 0.1 10*3/uL (ref 0.0–0.7)
Eosinophils Relative: 1.2 % (ref 0.0–5.0)
HCT: 39.4 % (ref 36.0–46.0)
Hemoglobin: 12.7 g/dL (ref 12.0–15.0)
Lymphocytes Relative: 23.9 % (ref 12.0–46.0)
Lymphs Abs: 1.6 10*3/uL (ref 0.7–4.0)
MCHC: 32.1 g/dL (ref 30.0–36.0)
MCV: 85.2 fl (ref 78.0–100.0)
Monocytes Absolute: 0.4 10*3/uL (ref 0.1–1.0)
Monocytes Relative: 5.8 % (ref 3.0–12.0)
Neutro Abs: 4.6 10*3/uL (ref 1.4–7.7)
Neutrophils Relative %: 68.5 % (ref 43.0–77.0)
Platelets: 217 10*3/uL (ref 150.0–400.0)
RBC: 4.63 Mil/uL (ref 3.87–5.11)
RDW: 13.2 % (ref 11.5–15.5)
WBC: 6.6 10*3/uL (ref 4.0–10.5)

## 2021-06-15 LAB — LIPID PANEL
Cholesterol: 178 mg/dL (ref 0–200)
HDL: 44.1 mg/dL (ref >39.00)
LDL Cholesterol: 115 mg/dL — ABNORMAL HIGH (ref 0–99)
NonHDL: 134.3
Total CHOL/HDL Ratio: 4
Triglycerides: 98 mg/dL (ref 0.0–149.0)
VLDL: 19.6 mg/dL (ref 0.0–40.0)

## 2021-06-15 LAB — VITAMIN B12: Vitamin B-12: 198 pg/mL — ABNORMAL LOW (ref 211–911)

## 2021-06-15 LAB — TSH: TSH: 0.18 u[IU]/mL — ABNORMAL LOW (ref 0.35–5.50)

## 2021-06-15 LAB — VITAMIN D 25 HYDROXY (VIT D DEFICIENCY, FRACTURES): VITD: 14.57 ng/mL — ABNORMAL LOW (ref 30.00–100.00)

## 2021-06-15 MED ORDER — CYCLOBENZAPRINE HCL 5 MG PO TABS: ORAL_TABLET | ORAL | 1 refills | Status: DC

## 2021-06-15 MED ORDER — VITAMIN D (ERGOCALCIFEROL) 1.25 MG (50000 UNIT) PO CAPS: 50000.0000 [IU] | ORAL_CAPSULE | ORAL | 0 refills | Status: DC

## 2021-06-15 NOTE — Progress Notes (Signed)
Subjective:     Renee Day is a 50 y.o. female and is here for a comprehensive physical exam. The patient reports  fatigue x last 2 wks.  Pt does note decreased sleep as binge watching a series.  Mild enlargement of goiter.  Very rarely notices dysphagia .  Running for exercise.  Notes left sciatic nerve symptoms flareup every now and then.  Stretching regularly.  Patient plans to run marathon in [place] in 2 years for her 52th birthday.  Social History   Socioeconomic History   Marital status: Married    Spouse name: Not on file   Number of children: Not on file   Years of education: Not on file   Highest education level: Not on file  Occupational History   Not on file  Tobacco Use   Smoking status: Never   Smokeless tobacco: Never  Vaping Use   Vaping Use: Never used  Substance and Sexual Activity   Alcohol use: Not Currently   Drug use: Never   Sexual activity: Yes  Other Topics Concern   Not on file  Social History Narrative   Not on file   Social Determinants of Health   Financial Resource Strain: Not on file  Food Insecurity: Not on file  Transportation Needs: Not on file  Physical Activity: Not on file  Stress: Not on file  Social Connections: Not on file  Intimate Partner Violence: Not on file   Health Maintenance  Topic Date Due   URINE MICROALBUMIN  Never done   HIV Screening  Never done   Hepatitis C Screening  Never done   PAP SMEAR-Modifier  04/29/2020   COVID-19 Vaccine (4 - Booster for [place] series) 09/12/2020   INFLUENZA VACCINE  04/02/2021   TETANUS/TDAP  05/13/2027   COLONOSCOPY (Pts 45-75yrs Insurance coverage will need to be confirmed)  02/20/2030   HPV VACCINES  Aged Out    The following portions of the patient's history were reviewed and updated as appropriate: allergies, current medications, past family history, past medical history, past social history, past surgical history, and problem list.  Review of Systems Pertinent  items noted in HPI and remainder of comprehensive ROS otherwise negative.   Objective:    BP 112/84 (BP Location: Right Arm, Patient Position: Sitting, Cuff Size: Normal)   Pulse 72   Temp 97.8 F (36.6 C) (Oral)   Ht 5\' 3"  (1.6 m)   Wt 214 lb 3.2 oz (97.2 kg)   LMP 06/07/2021   SpO2 (!) 72%   BMI 37.94 kg/m  General appearance: alert, cooperative, and no distress Head: Normocephalic, without obvious abnormality, atraumatic Eyes: conjunctivae/corneas clear. PERRL, EOM's intact. Fundi benign. Ears: normal TM's and external ear canals both ears Nose: Nares normal. Septum midline. Mucosa normal. No drainage or sinus tenderness. Throat: lips, mucosa, and tongue normal; teeth and gums normal Neck: no adenopathy, no carotid bruit, no JVD, supple, symmetrical, trachea midline, and thyromegally with R lobe>L, smooth. Lungs: clear to auscultation bilaterally Heart: regular rate and rhythm, S1, S2 normal, no murmur, click, rub or gallop Abdomen: soft, non-tender; bowel sounds normal; no masses,  no organomegaly Extremities: extremities normal, atraumatic, no cyanosis or edema Pulses: 2+ and symmetric Skin: Skin color, texture, turgor normal. No rashes or lesions Lymph nodes: Cervical, supraclavicular, and axillary nodes normal. Neurologic: Alert and oriented X 3, normal strength and tone. Normal symmetric reflexes. Normal coordination and gait    Assessment:    Healthy female exam.  Plan:    Anticipatory guidance given including wearing seatbelts, smoke detectors in the home, increasing physical activity, increasing p.o. intake of water and vegetables. -labs -pt to schedule mammogram -colonoscopy up to date, done 02/21/20 -discussed recommended immunizations -next CPE in 1 yr See After Visit Summary for Counseling Recommendations   Fatigue, unspecified type  - Plan: CBC with Differential/Platelet, Basic metabolic panel, TSH, T4, Free, Hemoglobin A1c, Vitamin B12, Vitamin D,  25-hydroxy  Need for influenza vaccination -given this visit  Sciatica of left side -continue stretching, massage, ice, and other supportive care -discussed ergonomics -flexeril prn   Nontoxic multinodular goiter  -thyroid u/s 02/28/21 R lobe larger than L.  Large 3.6 cm spongiform nodule in R upper gland previously biopsied.  Spongiform configuration is typically considered benign/low risk -continue to f/u with Endo - Plan: Basic metabolic panel, TSH  Screening for lipid disorders  - Plan: Lipid panel  F/u prn  Grier Mitts, MD

## 2021-06-19 ENCOUNTER — Ambulatory Visit (INDEPENDENT_AMBULATORY_CARE_PROVIDER_SITE_OTHER): Payer: 59

## 2021-06-19 ENCOUNTER — Other Ambulatory Visit: Payer: Self-pay

## 2021-06-19 DIAGNOSIS — E538 Deficiency of other specified B group vitamins: Secondary | ICD-10-CM | POA: Diagnosis not present

## 2021-06-19 MED ORDER — CYANOCOBALAMIN 1000 MCG/ML IJ SOLN
1000.0000 ug | INTRAMUSCULAR | Status: AC
  Administered 2021-06-19 – 2021-07-20 (×2): 1000 ug via INTRAMUSCULAR

## 2021-06-19 NOTE — Progress Notes (Signed)
Pt here for monthly B12 injection per Dr Volanda Napoleon  B12 1069mcg given IM right deltoid and pt tolerated injection well.  Next B12 injection scheduled for 07/20/21.  Dr [place]: please cosign since PCP is out of office.

## 2021-06-20 ENCOUNTER — Other Ambulatory Visit: Payer: Self-pay | Admitting: Family Medicine

## 2021-06-20 DIAGNOSIS — L853 Xerosis cutis: Secondary | ICD-10-CM

## 2021-06-27 ENCOUNTER — Ambulatory Visit: Payer: 59 | Attending: Internal Medicine

## 2021-06-27 ENCOUNTER — Other Ambulatory Visit (HOSPITAL_BASED_OUTPATIENT_CLINIC_OR_DEPARTMENT_OTHER): Payer: Self-pay

## 2021-06-27 DIAGNOSIS — Z23 Encounter for immunization: Secondary | ICD-10-CM

## 2021-06-27 MED ORDER — PFIZER COVID-19 VAC BIVALENT 30 MCG/0.3ML IM SUSP
INTRAMUSCULAR | 0 refills | Status: DC
  Filled 2021-06-27: qty 0.3, 1d supply, fill #0

## 2021-06-27 NOTE — Progress Notes (Signed)
   Covid-19 Vaccination Clinic  Name:  Renee Day    MRN: [id number] DOB: 1971/03/15  06/27/2021  [name] was observed post Covid-19 immunization for 15 minutes without incident. She was provided with Vaccine Information Sheet and instruction to access the V-Safe system.   [name] was instructed to call 911 with any severe reactions post vaccine: Difficulty breathing  Swelling of face and throat  A fast heartbeat  A bad rash all over body  Dizziness and weakness   Immunizations Administered     Name Date Dose VIS Date Route   Pfizer Covid-19 Vaccine Bivalent Booster 06/27/2021  2:59 PM 0.3 mL 05/02/2021 Intramuscular   Manufacturer: [place]   Lot: [id number]   [place]: [phone]

## 2021-07-04 ENCOUNTER — Other Ambulatory Visit: Payer: Self-pay | Admitting: Family Medicine

## 2021-07-04 DIAGNOSIS — L853 Xerosis cutis: Secondary | ICD-10-CM

## 2021-07-10 ENCOUNTER — Other Ambulatory Visit: Payer: Self-pay | Admitting: Otolaryngology

## 2021-07-10 DIAGNOSIS — E041 Nontoxic single thyroid nodule: Secondary | ICD-10-CM

## 2021-07-18 ENCOUNTER — Ambulatory Visit
Admission: RE | Admit: 2021-07-18 | Discharge: 2021-07-18 | Disposition: A | Discharge status: Home / Self Care | Payer: 59 | Source: Ambulatory Visit | Attending: Otolaryngology | Admitting: Otolaryngology

## 2021-07-18 DIAGNOSIS — E041 Nontoxic single thyroid nodule: Secondary | ICD-10-CM

## 2021-07-20 ENCOUNTER — Ambulatory Visit (INDEPENDENT_AMBULATORY_CARE_PROVIDER_SITE_OTHER): Payer: 59

## 2021-07-20 DIAGNOSIS — E538 Deficiency of other specified B group vitamins: Secondary | ICD-10-CM | POA: Diagnosis not present

## 2021-07-20 NOTE — Progress Notes (Signed)
Per orders of Dr. Banks, injection of Cyanocobalamin 1000 mcg given by Semaja Lymon L Novali Vollman. °Patient tolerated injection well.  °

## 2021-08-09 ENCOUNTER — Telehealth: Payer: Self-pay | Admitting: Family Medicine

## 2021-08-09 NOTE — Telephone Encounter (Signed)
Patient called to see if office could email a copy of her physical to her so she could participate in an activity while on vacation. I let patient know that, per Madaline Guthrie, emails cannot be sent and a copy would be in mychart. Patient states she is having a hard time logging in to [facility], but she will pay for a physician there to give her the exam so she can participate.

## 2021-08-17 ENCOUNTER — Encounter: Payer: Self-pay | Admitting: Family Medicine

## 2021-08-17 ENCOUNTER — Other Ambulatory Visit (HOSPITAL_COMMUNITY)
Admission: RE | Admit: 2021-08-17 | Discharge: 2021-08-17 | Disposition: A | Discharge status: Home / Self Care | Payer: 59 | Source: Ambulatory Visit | Attending: Family Medicine | Admitting: Family Medicine

## 2021-08-17 ENCOUNTER — Other Ambulatory Visit: Payer: Self-pay

## 2021-08-17 ENCOUNTER — Encounter: Payer: Self-pay | Admitting: Endocrinology

## 2021-08-17 ENCOUNTER — Ambulatory Visit: Payer: 59 | Admitting: Family Medicine

## 2021-08-17 ENCOUNTER — Ambulatory Visit: Payer: 59 | Admitting: Endocrinology

## 2021-08-17 VITALS — BP 122/80 | HR 72 | Ht 63.0 in | Wt 223.8 lb

## 2021-08-17 VITALS — BP 100/72 | HR 73 | Temp 98.5°F | Wt 229.0 lb

## 2021-08-17 DIAGNOSIS — N760 Acute vaginitis: Secondary | ICD-10-CM

## 2021-08-17 DIAGNOSIS — E0590 Thyrotoxicosis, unspecified without thyrotoxic crisis or storm: Secondary | ICD-10-CM | POA: Diagnosis not present

## 2021-08-17 LAB — POCT URINALYSIS DIPSTICK
Bilirubin, UA: NEGATIVE
Blood, UA: NEGATIVE
Glucose, UA: NEGATIVE
Ketones, UA: NEGATIVE
Leukocytes, UA: NEGATIVE
Nitrite, UA: NEGATIVE
Protein, UA: NEGATIVE
Spec Grav, UA: 1.01 (ref 1.010–1.025)
Urobilinogen, UA: NEGATIVE E.U./dL — AB
pH, UA: 6 (ref 5.0–8.0)

## 2021-08-17 MED ORDER — FLUCONAZOLE 150 MG PO TABS: 150.0000 mg | ORAL_TABLET | ORAL | 0 refills | Status: DC

## 2021-08-17 NOTE — Patient Instructions (Addendum)
I would be happy to remove some of the fluid again.  most of the time, a "lumpy thyroid" will eventually become more overactive.  this is usually a slow process, happening over the span of many years.   Please come back for a follow-up appointment in 6 months.

## 2021-08-17 NOTE — Progress Notes (Signed)
Subjective:    Patient ID: Renee Day, female    DOB: 18-Jun-1971, 50 y.o.   MRN: [id number]  Chief Complaint  Patient presents with   Vaginal Itching    Since last Saturday, no changes. Monistat made it worse, tried it for 3 days.    HPI Patient was seen today for acute concern.  Pt endorses vaginal irritation after returning from vacation.  Pt tried OTC monistat for 3/7 day course, but it caused worsened symptoms.    Past Medical History:  Diagnosis Date   Asthma    Blood in stool    Migraine    Thyroid disease    UTI (urinary tract infection)     No Known Allergies  ROS General: Denies fever, chills, night sweats, changes in weight, changes in appetite HEENT: Denies headaches, ear pain, changes in vision, rhinorrhea, sore throat CV: Denies CP, palpitations, SOB, orthopnea Pulm: Denies SOB, cough, wheezing GI: Denies abdominal pain, nausea, vomiting, diarrhea, constipation GU: Denies dysuria, hematuria, frequency  + vaginal irritation Msk: Denies muscle cramps, joint pains Neuro: Denies weakness, numbness, tingling Skin: Denies rashes, bruising Psych: Denies depression, anxiety, hallucinations     Objective:    Blood pressure 100/72, pulse 73, temperature 98.5 F (36.9 C), temperature source Oral, weight 229 lb (103.9 kg), SpO2 98 %.  Gen. Pleasant, well-nourished, in no distress, normal affect   HEENT: [place]/AT, face symmetric, conjunctiva clear, no scleral icterus, PERRLA, EOMI, nares patent without drainage Lungs: no accessory muscle use Cardiovascular: RRR, no peripheral edema GU: aptima self swab done by pt Abdomen: BS present, soft, NT/ND, no hepatosplenomegaly. Musculoskeletal: No deformities, no cyanosis or clubbing, normal tone Neuro:  A&Ox3, CN II-XII intact, normal gait Skin:  Warm, no lesions/ rash   Wt Readings from Last 3 Encounters:  08/17/21 229 lb (103.9 kg)  08/17/21 223 lb 12.8 oz (101.5 kg)  06/15/21 214 lb 3.2 oz (97.2 kg)    Lab  Results  Component Value Date   WBC 6.6 06/15/2021   HGB 12.7 06/15/2021   HCT 39.4 06/15/2021   PLT 217.0 06/15/2021   GLUCOSE 89 06/15/2021   CHOL 178 06/15/2021   TRIG 98.0 06/15/2021   HDL 44.10 06/15/2021   LDLCALC 115 (H) 06/15/2021   ALT 12 01/19/2021   AST 14 01/19/2021   NA 137 06/15/2021   K 3.8 06/15/2021   CL 102 06/15/2021   CREATININE 0.73 06/15/2021   BUN 7 06/15/2021   CO2 28 06/15/2021   TSH 0.18 (L) 06/15/2021   HGBA1C 5.5 06/15/2021    Assessment/Plan:  Acute vaginitis -Discussed possible causes including [place] cystitis, BV, yeast, etc. -UA negative -Aptima self swab collected by pt -We will start Diflucan for yeast vaginitis -If needed will prescribe additional medication based on Aptima results -Given precautions -Continue using gentle soaps, lotions, and detergents  - Plan: Cervicovaginal ancillary only, POCT urinalysis dipstick, fluconazole (DIFLUCAN) 150 MG tablet  F/u prn  Grier Mitts, MD

## 2021-08-17 NOTE — Progress Notes (Signed)
Subjective:    Patient ID: Renee Day, female    DOB: [birth year], 50 y.o.   MRN: [id number]  HPI Pt returns for f/u of hyperthyroidism/MNG (MNG was dx'ed in 2015 (when she had aspiration of fluid), and hyperthyroidism in 2021; she has never been on therapy for this; US showed 3.6 cm spongiform nodule in the RUP, typically considered benign).  She says there is no change in the nodule Past Medical History:  Diagnosis Date   Asthma    Blood in stool    Migraine    Thyroid disease    UTI (urinary tract infection)     No past surgical history on file.  Social History   Socioeconomic History   Marital status: Married    Spouse name: Not on file   Number of children: Not on file   Years of education: Not on file   Highest education level: Not on file  Occupational History   Not on file  Tobacco Use   Smoking status: Never   Smokeless tobacco: Never  Vaping Use   Vaping Use: Never used  Substance and Sexual Activity   Alcohol use: Not Currently   Drug use: Never   Sexual activity: Yes  Other Topics Concern   Not on file  Social History Narrative   Not on file   Social Determinants of Health   Financial Resource Strain: Not on file  Food Insecurity: Not on file  Transportation Needs: Not on file  Physical Activity: Not on file  Stress: Not on file  Social Connections: Not on file  Intimate Partner Violence: Not on file    Current Outpatient Medications on File Prior to Visit  Medication Sig Dispense Refill   albuterol (VENTOLIN HFA) 108 (90 Base) MCG/ACT inhaler Inhale 2 puffs into the lungs every 6 (six) hours as needed for wheezing or shortness of breath. 8 g 3   COVID-19 mRNA bivalent vaccine, Pfizer, (PFIZER COVID-19 VAC BIVALENT) injection Inject into the muscle. 0.3 mL 0   cyclobenzaprine (FLEXERIL) 5 MG tablet Take up to 3 times a day as needed for symptoms related to sciatica. 30 tablet 1   fexofenadine (ALLEGRA) 180 MG tablet TAKE 1 TABLET BY  MOUTH AT BEDTIME AS NEEDED FOR ITCHING. 90 tablet 0   mupirocin cream (BACTROBAN) 2 % Apply 1 application topically 2 (two) times daily. 15 g 0   Vitamin D, Ergocalciferol, (DRISDOL) 1.25 MG (50000 UNIT) CAPS capsule Take 1 capsule (50,000 Units total) by mouth every 7 (seven) days. 12 capsule 0   Current Facility-Administered Medications on File Prior to Visit  Medication Dose Route Frequency Provider Last Rate Last Admin   cyanocobalamin ((VITAMIN B-12)) injection 1,000 mcg  1,000 mcg Intramuscular Q30 days Billie Ruddy, MD   1,000 mcg at 07/20/21 2706    No Known Allergies  Family History  Problem Relation Age of Onset   Cancer Mother    Diabetes Mother    Depression Father    Diabetes Father    Arthritis Sister    Depression Sister    Arthritis Maternal Grandmother    Diabetes Maternal Grandmother    COPD Maternal Grandfather    Early death Maternal Grandfather    Diabetes Paternal Grandmother    Depression Paternal Grandmother    Arthritis Paternal Grandmother    Thyroid disease Neg Hx     BP 122/80 (BP Location: Right Arm, Patient Position: Sitting, Cuff Size: Normal)    Pulse 72    Ht  5\' 3"  (1.6 m)    Wt 223 lb 12.8 oz (101.5 kg)    SpO2 99%    BMI 39.64 kg/m    Review of Systems     Objective:   Physical Exam VITAL SIGNS:  See vs page GENERAL: no distress NECK: the entire right lobe is 3-5 times normal size.         Assessment & Plan:  MNG: clinicaly stable Hyperthyroidism. She declines repeat TFT today  Patient Instructions  I would be happy to remove some of the fluid again.  most of the time, a "lumpy thyroid" will eventually become more overactive.  this is usually a slow process, happening over the span of many years.  Please come back for a follow-up appointment in 6 months.

## 2021-08-17 NOTE — Patient Instructions (Signed)
The urinalysis was normal.  I have sent in a prescription for Diflucan to your pharmacy.  You can take one pill now, then repeat the dose in 3 days.   The other sample will be sent to the lab and if needed we will send in any further prescriptions.  As per the referral to ENT I am not sure who placed it as I do not see any referral orders in the system.  You might need to contact their office to inquire about the appointment.

## 2021-08-20 ENCOUNTER — Ambulatory Visit (INDEPENDENT_AMBULATORY_CARE_PROVIDER_SITE_OTHER): Payer: 59

## 2021-08-20 DIAGNOSIS — E538 Deficiency of other specified B group vitamins: Secondary | ICD-10-CM | POA: Diagnosis not present

## 2021-08-20 LAB — CERVICOVAGINAL ANCILLARY ONLY
Bacterial Vaginitis (gardnerella): NEGATIVE
Candida Glabrata: NEGATIVE
Candida Vaginitis: NEGATIVE
Chlamydia: NEGATIVE
Comment: NEGATIVE
Comment: NEGATIVE
Comment: NEGATIVE
Comment: NEGATIVE
Comment: NEGATIVE
Comment: NORMAL
Neisseria Gonorrhea: NEGATIVE
Trichomonas: NEGATIVE

## 2021-08-20 MED ORDER — CYANOCOBALAMIN 1000 MCG/ML IJ SOLN
1000.0000 ug | Freq: Once | INTRAMUSCULAR | Status: AC
  Administered 2021-08-20: 10:00:00 1000 ug via INTRAMUSCULAR

## 2021-08-20 NOTE — Progress Notes (Signed)
Pt here for third of 3 monthly B12 injection per Dr Volanda Napoleon.  B12 102mcg given IM, and pt tolerated injection well.

## 2021-08-22 ENCOUNTER — Ambulatory Visit: Payer: 59 | Admitting: Family Medicine

## 2021-09-05 ENCOUNTER — Other Ambulatory Visit: Payer: Self-pay | Admitting: Family Medicine

## 2021-09-05 DIAGNOSIS — E559 Vitamin D deficiency, unspecified: Secondary | ICD-10-CM

## 2021-09-13 ENCOUNTER — Encounter: Payer: Self-pay | Admitting: Family Medicine

## 2021-09-13 ENCOUNTER — Ambulatory Visit: Payer: 59 | Admitting: Family Medicine

## 2021-09-13 VITALS — BP 124/88 | HR 82 | Temp 99.1°F

## 2021-09-13 DIAGNOSIS — J029 Acute pharyngitis, unspecified: Secondary | ICD-10-CM

## 2021-09-13 DIAGNOSIS — T3695XA Adverse effect of unspecified systemic antibiotic, initial encounter: Secondary | ICD-10-CM

## 2021-09-13 DIAGNOSIS — B379 Candidiasis, unspecified: Secondary | ICD-10-CM

## 2021-09-13 DIAGNOSIS — J069 Acute upper respiratory infection, unspecified: Secondary | ICD-10-CM

## 2021-09-13 DIAGNOSIS — H66001 Acute suppurative otitis media without spontaneous rupture of ear drum, right ear: Secondary | ICD-10-CM

## 2021-09-13 LAB — POCT RAPID STREP A (OFFICE): Rapid Strep A Screen: NEGATIVE

## 2021-09-13 LAB — POC COVID19 BINAXNOW: SARS Coronavirus 2 Ag: NEGATIVE

## 2021-09-13 MED ORDER — AMOXICILLIN-POT CLAVULANATE 500-125 MG PO TABS: 1.0000 | ORAL_TABLET | Freq: Two times a day (BID) | ORAL | 0 refills | Status: AC

## 2021-09-13 MED ORDER — FLUCONAZOLE 150 MG PO TABS: 150.0000 mg | ORAL_TABLET | ORAL | 0 refills | Status: DC

## 2021-09-13 NOTE — Progress Notes (Signed)
Subjective:    Patient ID: Renee Day, female    DOB: May 06, 1971, 51 y.o.   MRN: [id number]  Chief Complaint  Patient presents with   Sore Throat    Almost a week, since Saturday, hard to swallow. Tried the ginger and turmeric.    HPI Patient was seen today for acute concern.  Patient endorses sore throat starting last weekend then becoming worse on Monday.  Endorses pain with swallowing.  Also notes left ear ache, ears feeling stopped up, cough, and headache.  Patient tried ginger and turmeric for symptoms.  Possible sick contacts include children pt was around.  Past Medical History:  Diagnosis Date   Asthma    Blood in stool    Migraine    Thyroid disease    UTI (urinary tract infection)     No Known Allergies  ROS General: Denies fever, chills, night sweats, changes in weight, changes in appetite HEENT: Denies changes in vision, rhinorrhea, sore throat + sore throat, headache, ear pain CV: Denies CP, palpitations, SOB, orthopnea Pulm: Denies SOB, wheezing  + cough GI: Denies abdominal pain, nausea, vomiting, diarrhea, constipation GU: Denies dysuria, hematuria, frequency, vaginal discharge Msk: Denies muscle cramps, joint pains Neuro: Denies weakness, numbness, tingling Skin: Denies rashes, bruising Psych: Denies depression, anxiety, hallucinations     Objective:    Blood pressure 124/88, pulse 82, temperature 99.1 F (37.3 C), temperature source Oral, SpO2 97 %.  Gen. Pleasant, well-nourished, in no distress, normal affect   HEENT: [place]/AT, face symmetric, conjunctiva clear, no scleral icterus, PERRLA, EOMI, nares patent without drainage, pharynx with erythema, no exudate.  Cervical lymphadenopathy.  Left TM full.  Right TM bulging with erythema and suppurative fluid. Lungs: Coughing, no accessory muscle use, CTAB, no wheezes or rales Cardiovascular: RRR, no m/r/g, no peripheral edema Musculoskeletal: No deformities, no cyanosis or clubbing, normal  tone Neuro:  A&Ox3, CN II-XII intact, normal gait Skin:  Warm, no lesions/ rash  Wt Readings from Last 3 Encounters:  08/17/21 229 lb (103.9 kg)  08/17/21 223 lb 12.8 oz (101.5 kg)  06/15/21 214 lb 3.2 oz (97.2 kg)    Lab Results  Component Value Date   WBC 6.6 06/15/2021   HGB 12.7 06/15/2021   HCT 39.4 06/15/2021   PLT 217.0 06/15/2021   GLUCOSE 89 06/15/2021   CHOL 178 06/15/2021   TRIG 98.0 06/15/2021   HDL 44.10 06/15/2021   LDLCALC 115 (H) 06/15/2021   ALT 12 01/19/2021   AST 14 01/19/2021   NA 137 06/15/2021   K 3.8 06/15/2021   CL 102 06/15/2021   CREATININE 0.73 06/15/2021   BUN 7 06/15/2021   CO2 28 06/15/2021   TSH 0.18 (L) 06/15/2021   HGBA1C 5.5 06/15/2021    Assessment/Plan:  Acute suppurative otitis media of right ear without spontaneous rupture of tympanic membrane, recurrence not specified -Tylenol or NSAIDs for any pain/discomfort -Saline nasal rinse or Flonase nasal spray - Plan: amoxicillin-clavulanate (AUGMENTIN) 500-125 MG tablet  Sore throat  -COVID testing and rapid strep negative -Discussed supportive care including gargling with warm salt water Chloraseptic spray, warm fluids, honey - Plan: POC COVID-19, POC Rapid Strep A  Antibiotic-induced yeast infection  - Plan: fluconazole (DIFLUCAN) 150 MG tablet  Viral URI with cough -POC COVID and rapid strep testing negative -Likely acute nasopharyngitis -Rest, antihistamines, decongestants, nasal saline rinse, steam from shower, Tylenol or NSAIDs as needed and other supportive care -Given precautions  F/u as needed for continued or worsening symptoms  Grier Mitts, MD

## 2021-09-24 ENCOUNTER — Ambulatory Visit: Payer: 59 | Admitting: Family Medicine

## 2021-09-24 ENCOUNTER — Encounter: Payer: Self-pay | Admitting: Family Medicine

## 2021-09-24 VITALS — BP 119/79 | HR 68 | Temp 98.5°F | Resp 18 | Wt 218.2 lb

## 2021-09-24 DIAGNOSIS — J029 Acute pharyngitis, unspecified: Secondary | ICD-10-CM

## 2021-09-24 DIAGNOSIS — H65194 Other acute nonsuppurative otitis media, recurrent, right ear: Secondary | ICD-10-CM | POA: Diagnosis not present

## 2021-09-24 MED ORDER — AZITHROMYCIN 250 MG PO TABS: ORAL_TABLET | ORAL | 0 refills | Status: AC

## 2021-09-24 NOTE — Progress Notes (Signed)
Subjective:    Patient ID: Renee Day, female    DOB: 09-18-1970, 51 y.o.   MRN: [id number]  Chief Complaint  Patient presents with   Ear Pain    Pt states she has been having ear pain and throat pain and when she coughs pink tinged mucus is coming out. She also stated she experiencing chest pain     HPI Patient was seen today for follow-up on recent ear pain and sore throat.  Patient seen on 09/13/2021 for viral URI with AOM.  Strep and COVID testing negative.  Given Augmentin.  Pt had improvement in symptoms, but notes symptoms returned a few days after completing abx.  Endorses continued right ear pain with discomfort into neck, tinnitus, sore throat with difficulty swallowing, and occasional cough worse at night.  Had brief chest discomfort, now resolved.  Patient denies fever, chills, ear drainage.  Past Medical History:  Diagnosis Date   Asthma    Blood in stool    Migraine    Thyroid disease    UTI (urinary tract infection)     No Known Allergies  ROS General: Denies fever, chills, night sweats, changes in weight, changes in appetite HEENT: Denies headaches, changes in vision, rhinorrhea  +R ear pain, r sided neck discomfort, sore throat, tinnitus CV: Denies CP, palpitations, SOB, orthopnea Pulm: Denies SOB, cough, wheezing GI: Denies abdominal pain, nausea, vomiting, diarrhea, constipation GU: Denies dysuria, hematuria, frequency, vaginal discharge Msk: Denies muscle cramps, joint pains Neuro: Denies weakness, numbness, tingling Skin: Denies rashes, bruising Psych: Denies depression, anxiety, hallucinations  Objective:    Blood pressure 119/79, pulse 68, temperature 98.5 F (36.9 C), temperature source Oral, resp. rate 18, weight 218 lb 3.2 oz (99 kg), SpO2 99 %.  Gen. Pleasant, well-nourished, in no distress, normal affect   HEENT: [place]/AT, face symmetric, conjunctiva clear, no scleral icterus, PERRLA, EOMI, nares patent without drainage, pharynx with erythema,  no exudate.  L TM normal.  R TM dull in color, mild retraction, mild erythema at edge.  No TM rupture b/l. Neck: No JVD, symmetric thyromegaly Lungs: no accessory muscle use, CTAB, no wheezes or rales Cardiovascular: RRR, no m/r/g, no peripheral edema Musculoskeletal: No deformities, no cyanosis or clubbing, normal tone Neuro:  A&Ox3, CN II-XII intact, normal gait Skin:  Warm, no lesions/ rash   Wt Readings from Last 3 Encounters:  09/24/21 218 lb 3.2 oz (99 kg)  08/17/21 229 lb (103.9 kg)  08/17/21 223 lb 12.8 oz (101.5 kg)    Lab Results  Component Value Date   WBC 6.6 06/15/2021   HGB 12.7 06/15/2021   HCT 39.4 06/15/2021   PLT 217.0 06/15/2021   GLUCOSE 89 06/15/2021   CHOL 178 06/15/2021   TRIG 98.0 06/15/2021   HDL 44.10 06/15/2021   LDLCALC 115 (H) 06/15/2021   ALT 12 01/19/2021   AST 14 01/19/2021   NA 137 06/15/2021   K 3.8 06/15/2021   CL 102 06/15/2021   CREATININE 0.73 06/15/2021   BUN 7 06/15/2021   CO2 28 06/15/2021   TSH 0.18 (L) 06/15/2021   HGBA1C 5.5 06/15/2021    Assessment/Plan:  Other recurrent acute nonsuppurative otitis media of right ear -s/p augmentin -no rupture of TM noted -abx -OTC antihistamine po or nasal spray such as Flonase. -for continued sx ENT referral.  - Plan: azithromycin (ZITHROMAX) 250 MG tablet  Sore throat -consider bacterial infection given duration of symptoms.  Goiter stable. -supportive care including gargling with warm salt water or  Chloraseptic spray. -Plan: azithromycin (ZITHROMAX) 250 MG tablet  F/u prn  Grier Mitts, MD

## 2021-09-24 NOTE — Patient Instructions (Signed)
You can also use Flonase nasal spray or an allergy pill such as Zyrtec, Claritin, or Allegra to help with your symptoms.  If you continue to have symptoms despite the second round of antibiotics please let us know so we can send you to the ENT.

## 2021-10-08 ENCOUNTER — Ambulatory Visit: Payer: 59 | Admitting: Family Medicine

## 2021-10-08 ENCOUNTER — Encounter: Payer: Self-pay | Admitting: Family Medicine

## 2021-10-08 VITALS — BP 120/78 | HR 79 | Temp 98.9°F | Ht 63.0 in | Wt 220.0 lb

## 2021-10-08 DIAGNOSIS — H9201 Otalgia, right ear: Secondary | ICD-10-CM | POA: Diagnosis not present

## 2021-10-08 DIAGNOSIS — J029 Acute pharyngitis, unspecified: Secondary | ICD-10-CM | POA: Diagnosis not present

## 2021-10-08 LAB — POCT RAPID STREP A (OFFICE): Rapid Strep A Screen: NEGATIVE

## 2021-10-08 MED ORDER — FLUTICASONE PROPIONATE 50 MCG/ACT NA SUSP: 1.0000 | Freq: Every day | NASAL | 6 refills | Status: DC

## 2021-10-08 NOTE — Progress Notes (Signed)
Subjective:    Patient ID: Renee Day, female    DOB: 28-Oct-1970, 51 y.o.   MRN: [id number]  Chief Complaint  Patient presents with   Ear Pain    HPI Patient was seen today for ongoing concern.  Patient endorses right ear discomfort and sore throat x2 days.  Patient previously seen on 09/13/2021 for viral URI with AOM, given Augmentin.  Patient seen again on 09/24/2021 for continued symptoms.  Given azithromycin.  Patient notes symptoms improved but seem to come back 2 days ago.  Patient denies cough, fever, chills, facial pain/pressure, rhinorrhea, recent travel.  Patient is scheduled to fly out of town on Tuesday.  Inquires if possible shellfish allergy could be contributing to symptoms as she had shellfish 2 days ago.  Past Medical History:  Diagnosis Date   Asthma    Blood in stool    Migraine    Thyroid disease    UTI (urinary tract infection)     No Known Allergies  ROS General: Denies fever, chills, night sweats, changes in weight, changes in appetite HEENT: Denies headaches, changes in vision, rhinorrhea  + sore throat, right ear pain CV: Denies CP, palpitations, SOB, orthopnea Pulm: Denies SOB, cough, wheezing GI: Denies abdominal pain, nausea, vomiting, diarrhea, constipation GU: Denies dysuria, hematuria, frequency, vaginal discharge Msk: Denies muscle cramps, joint pains Neuro: Denies weakness, numbness, tingling Skin: Denies rashes, bruising Psych: Denies depression, anxiety, hallucinations     Objective:    Blood pressure 120/78, pulse 79, temperature 98.9 F (37.2 C), temperature source Oral, height 5\' 3"  (1.6 m), weight 220 lb (99.8 kg), SpO2 97 %.  Gen. Pleasant, well-nourished, in no distress, normal affect   HEENT: [place]/AT, face symmetric, conjunctiva clear, no scleral icterus, PERRLA, EOMI, nares patent without drainage, Mallampati 3, pharynx with erythema, no exudate.  No cervical, preauricular, or occipital lymphadenopathy.  Thyromegaly with right  lobe>L lobe.  Bilateral external ears and canal normal.  Bilateral TMs flat, dull in color.   Lungs: no accessory muscle use Cardiovascular: RRR, no peripheral edema Neuro:  A&Ox3, CN II-XII intact, normal gait Skin:  Warm, no lesions/ rash   Wt Readings from Last 3 Encounters:  10/08/21 220 lb (99.8 kg)  09/24/21 218 lb 3.2 oz (99 kg)  08/17/21 229 lb (103.9 kg)    Lab Results  Component Value Date   WBC 6.6 06/15/2021   HGB 12.7 06/15/2021   HCT 39.4 06/15/2021   PLT 217.0 06/15/2021   GLUCOSE 89 06/15/2021   CHOL 178 06/15/2021   TRIG 98.0 06/15/2021   HDL 44.10 06/15/2021   LDLCALC 115 (H) 06/15/2021   ALT 12 01/19/2021   AST 14 01/19/2021   NA 137 06/15/2021   K 3.8 06/15/2021   CL 102 06/15/2021   CREATININE 0.73 06/15/2021   BUN 7 06/15/2021   CO2 28 06/15/2021   TSH 0.18 (L) 06/15/2021   HGBA1C 5.5 06/15/2021    Assessment/Plan:  Sore throat  -Return of symptoms -Continue supportive care including gargling with warm salt water or Chloraseptic spray -Continue Allegra as needed -Discussed starting Flonase as postnasal drainage may be contributing to throat irritation -POC rapid strep testing negative this visit - Plan: POC Rapid Strep A, fluticasone (FLONASE) 50 MCG/ACT nasal spray, Ambulatory referral to ENT  Otalgia of right ear -Return of symptoms -Signs of infection on exam -discussed possible causes including allergies and Eustachian tube dysfunction -Tylenol as needed for pain/discomfort -Given continued symptoms will place referral to ENT - Plan: fluticasone (  FLONASE) 50 MCG/ACT nasal spray, Ambulatory referral to ENT  F/u as needed  Grier Mitts, MD

## 2021-10-08 NOTE — Patient Instructions (Addendum)
A referral to ENT has been placed.  You should expect a phone call about scheduling this appointment.  A prescription for Flonase was sent to your pharmacy.   Strep testing was negative.

## 2021-10-26 ENCOUNTER — Encounter: Payer: Self-pay | Admitting: Family Medicine

## 2021-10-26 DIAGNOSIS — H65194 Other acute nonsuppurative otitis media, recurrent, right ear: Secondary | ICD-10-CM

## 2021-10-26 DIAGNOSIS — H9201 Otalgia, right ear: Secondary | ICD-10-CM

## 2021-10-26 DIAGNOSIS — J029 Acute pharyngitis, unspecified: Secondary | ICD-10-CM

## 2021-10-29 LAB — HM PAP SMEAR: HM Pap smear: NORMAL

## 2021-12-05 ENCOUNTER — Ambulatory Visit: Payer: 59 | Admitting: Endocrinology

## 2021-12-05 ENCOUNTER — Encounter: Payer: Self-pay | Admitting: Endocrinology

## 2021-12-05 VITALS — BP 100/74 | HR 76 | Ht 63.0 in | Wt 224.6 lb

## 2021-12-05 DIAGNOSIS — E0590 Thyrotoxicosis, unspecified without thyrotoxic crisis or storm: Secondary | ICD-10-CM | POA: Diagnosis not present

## 2021-12-05 DIAGNOSIS — E042 Nontoxic multinodular goiter: Secondary | ICD-10-CM | POA: Diagnosis not present

## 2021-12-05 LAB — TSH: TSH: 0.43 u[IU]/mL (ref 0.35–5.50)

## 2021-12-05 LAB — T4, FREE: Free T4: 1.01 ng/dL (ref 0.60–1.60)

## 2021-12-05 NOTE — Progress Notes (Signed)
? ?Subjective:  ? ? Patient ID: Renee Day, female    DOB: [birth year], 51 y.o.   MRN: [id number] ? ?HPI ?Pt returns for f/u of hyperthyroidism/MNG (MNG was dx'ed in 2015 (when she had aspiration of fluid), and hyperthyroidism in 2021; she has never been on therapy for this; US showed 3.6 cm spongiform nodule in the RUP, typically considered benign).  She says there is no change in the nodule.  pt states she feels well in general.  Specifically, she denies palpitations and tremor.   ?Past Medical History:  ?Diagnosis Date  ? Asthma   ? Blood in stool   ? Migraine   ? Thyroid disease   ? UTI (urinary tract infection)   ? ? ?No past surgical history on file. ? ?Social History  ? ?Socioeconomic History  ? Marital status: Married  ?  Spouse name: Not on file  ? Number of children: Not on file  ? Years of education: Not on file  ? Highest education level: Not on file  ?Occupational History  ? Not on file  ?Tobacco Use  ? Smoking status: Never  ? Smokeless tobacco: Never  ?Vaping Use  ? Vaping Use: Never used  ?Substance and Sexual Activity  ? Alcohol use: Not Currently  ? Drug use: Never  ? Sexual activity: Yes  ?Other Topics Concern  ? Not on file  ?Social History Narrative  ? Not on file  ? ?Social Determinants of Health  ? ?Financial Resource Strain: Not on file  ?Food Insecurity: Not on file  ?Transportation Needs: Not on file  ?Physical Activity: Not on file  ?Stress: Not on file  ?Social Connections: Not on file  ?Intimate Partner Violence: Not on file  ? ? ?Current Outpatient Medications on File Prior to Visit  ?Medication Sig Dispense Refill  ? fluticasone (FLONASE) 50 MCG/ACT nasal spray Place 1 spray into both nostrils daily. 16 g 6  ? albuterol (VENTOLIN HFA) 108 (90 Base) MCG/ACT inhaler Inhale 2 puffs into the lungs every 6 (six) hours as needed for wheezing or shortness of breath. (Patient not taking: Reported on 12/05/2021) 8 g 3  ? cyclobenzaprine (FLEXERIL) 5 MG tablet Take up to 3 times a day as  needed for symptoms related to sciatica. (Patient not taking: Reported on 12/05/2021) 30 tablet 1  ? ?No current facility-administered medications on file prior to visit.  ? ? ?No Known Allergies ? ?Family History  ?Problem Relation Age of Onset  ? Cancer Mother   ? Diabetes Mother   ? Depression Father   ? Diabetes Father   ? Arthritis Sister   ? Depression Sister   ? Arthritis Maternal Grandmother   ? Diabetes Maternal Grandmother   ? COPD Maternal Grandfather   ? Early death Maternal Grandfather   ? Diabetes Paternal Grandmother   ? Depression Paternal Grandmother   ? Arthritis Paternal Grandmother   ? Thyroid disease Neg Hx   ? ? ?BP 100/74 (BP Location: Left Arm, Patient Position: Sitting, Cuff Size: Normal)   Pulse 76   Ht '5\' 3"'$  (1.6 m)   Wt 224 lb 9.6 oz (101.9 kg)   SpO2 100%   BMI 39.79 kg/m?  ? ? ?Review of Systems ?Denies sob ?   ?Objective:  ? Physical Exam ?VITAL SIGNS:  See vs page ?GENERAL: no distress ?NECK: the entire right lobe is 3-5 times normal size.   ? ? ?Lab Results  ?Component Value Date  ? TSH 0.43 12/05/2021  ? ?   ?  Assessment & Plan:  ?Hyperthyroidism: stable off rx.  No medication is needed for this.  ?MNG: clinically stable ? ? ? ?

## 2021-12-05 NOTE — Patient Instructions (Addendum)
Blood tests are requested for you today.  We'll let you know about the results.    ?most of the time, a "lumpy thyroid" will eventually become more overactive.  this is usually a slow process, happening over the span of many years.  If so, you could have the Radioactive iodine treatment pill:  ?let's check a thyroid "scan" (a special, but easy and painless type of thyroid x ray).  It works like this: you go to the x-ray department of the hospital to swallow a pill, which contains a miniscule amount of radiation.  You will not notice any symptoms from this.  You will go back to the x-ray department the next day, to lie down in front of a camera.  The results of this will be sent to me.   ?Based on the results, i hope to order for you a treatment pill of radioactive iodine.  Although it is a larger amount of radiation, you will again notice no symptoms from this.  The pill is gone from your body in a few days (during which you should stay away from other people), but takes several months to work.  Therefore, please return here approximately 6-8 weeks after the treatment.  This treatment has been available for many years, and the only known side-effect is an underactive thyroid.  It is possible that i would eventually prescribe for you a thyroid hormone pill, which is very inexpensive.  You don't have to worry about side-effects of this thyroid hormone pill, because it is the same molecule your thyroid makes.   ?Another alternative is to have all or part of the thyroid removed.   ?Please come back for a follow-up appointment in 6 months.   ?

## 2021-12-24 ENCOUNTER — Encounter: Payer: Self-pay | Admitting: Gastroenterology

## 2022-02-06 ENCOUNTER — Ambulatory Visit: Payer: 59 | Admitting: Endocrinology

## 2022-02-21 ENCOUNTER — Other Ambulatory Visit (INDEPENDENT_AMBULATORY_CARE_PROVIDER_SITE_OTHER): Payer: 59

## 2022-02-21 ENCOUNTER — Ambulatory Visit: Payer: 59 | Admitting: Gastroenterology

## 2022-02-21 ENCOUNTER — Encounter: Payer: Self-pay | Admitting: Gastroenterology

## 2022-02-21 VITALS — BP 114/66 | HR 70 | Ht 63.0 in | Wt 225.0 lb

## 2022-02-21 DIAGNOSIS — R140 Abdominal distension (gaseous): Secondary | ICD-10-CM

## 2022-02-21 DIAGNOSIS — K921 Melena: Secondary | ICD-10-CM | POA: Diagnosis not present

## 2022-02-21 DIAGNOSIS — E538 Deficiency of other specified B group vitamins: Secondary | ICD-10-CM

## 2022-02-21 DIAGNOSIS — E559 Vitamin D deficiency, unspecified: Secondary | ICD-10-CM

## 2022-02-21 LAB — CBC WITH DIFFERENTIAL/PLATELET
Basophils Absolute: 0 10*3/uL (ref 0.0–0.1)
Basophils Relative: 0.5 % (ref 0.0–3.0)
Eosinophils Absolute: 0.1 10*3/uL (ref 0.0–0.7)
Eosinophils Relative: 1 % (ref 0.0–5.0)
HCT: 41 % (ref 36.0–46.0)
Hemoglobin: 13.2 g/dL (ref 12.0–15.0)
Lymphocytes Relative: 22.8 % (ref 12.0–46.0)
Lymphs Abs: 2.1 10*3/uL (ref 0.7–4.0)
MCHC: 32.1 g/dL (ref 30.0–36.0)
MCV: 86.1 fl (ref 78.0–100.0)
Monocytes Absolute: 0.4 10*3/uL (ref 0.1–1.0)
Monocytes Relative: 4.7 % (ref 3.0–12.0)
Neutro Abs: 6.5 10*3/uL (ref 1.4–7.7)
Neutrophils Relative %: 71 % (ref 43.0–77.0)
Platelets: 244 10*3/uL (ref 150.0–400.0)
RBC: 4.76 Mil/uL (ref 3.87–5.11)
RDW: 13.1 % (ref 11.5–15.5)
WBC: 9.1 10*3/uL (ref 4.0–10.5)

## 2022-02-21 LAB — COMPREHENSIVE METABOLIC PANEL
ALT: 13 U/L (ref 0–35)
AST: 14 U/L (ref 0–37)
Albumin: 3.9 g/dL (ref 3.5–5.2)
Alkaline Phosphatase: 78 U/L (ref 39–117)
BUN: 9 mg/dL (ref 6–23)
CO2: 29 mEq/L (ref 19–32)
Calcium: 9.3 mg/dL (ref 8.4–10.5)
Chloride: 102 mEq/L (ref 96–112)
Creatinine, Ser: 0.71 mg/dL (ref 0.40–1.20)
GFR: 100.3 mL/min (ref >60.00)
Glucose, Bld: 85 mg/dL (ref 70–99)
Potassium: 4 mEq/L (ref 3.5–5.1)
Sodium: 137 mEq/L (ref 135–145)
Total Bilirubin: 0.5 mg/dL (ref 0.2–1.2)
Total Protein: 8 g/dL (ref 6.0–8.3)

## 2022-02-21 LAB — IBC + FERRITIN
Ferritin: 51.5 ng/mL (ref 10.0–291.0)
Iron: 73 ug/dL (ref 42–145)
Saturation Ratios: 20.2 % (ref 20.0–50.0)
TIBC: 361.2 ug/dL (ref 250.0–450.0)
Transferrin: 258 mg/dL (ref 212.0–360.0)

## 2022-02-21 LAB — SEDIMENTATION RATE: Sed Rate: 57 mm/hr — ABNORMAL HIGH (ref 0–20)

## 2022-02-21 LAB — HIGH SENSITIVITY CRP: CRP, High Sensitivity: 6.12 mg/L — ABNORMAL HIGH (ref 0.000–5.000)

## 2022-02-21 LAB — B12 AND FOLATE PANEL
Folate: 14.1 ng/mL (ref >5.9)
Vitamin B-12: 361 pg/mL (ref 211–911)

## 2022-02-21 NOTE — Patient Instructions (Addendum)
You have been scheduled for an endoscopy and colonoscopy. Please follow the written instructions given to you at your visit today. Please pick up your prep supplies at the pharmacy within the next 1-3 days. If you use inhalers (even only as needed), please bring them with you on the day of your procedure.   Your provider has requested that you go to the basement level for lab work before leaving today. Press "B" on the elevator. The lab is located at the first door on the left as you exit the elevator.   Follow up per recommendations after  Endoscopy and colonoscopy   If you are age 49 or older, your body mass index should be between 23-30. Your Body mass index is 39.86 kg/m. If this is out of the aforementioned range listed, please consider follow up with your Primary Care Provider.  If you are age 74 or younger, your body mass index should be between 19-25. Your Body mass index is 39.86 kg/m. If this is out of the aformentioned range listed, please consider follow up with your Primary Care Provider.   ________________________________________________________  The [facility] providers would like to encourage you to use [facility] to communicate with providers for non-urgent requests or questions.  Due to long hold times on the telephone, sending your provider a message by [facility] may be a faster and more efficient way to get a response.  Please allow 48 business hours for a response.  Please remember that this is for non-urgent requests.  _______________________________________________________   I appreciate the  opportunity to care for you  Thank You   Harl Bowie , MD

## 2022-02-21 NOTE — Progress Notes (Signed)
Renee Day    [id number]    [birth year]  Primary Care Physician:Banks, Langley Adie, MD  Referring Physician: Billie Ruddy, MD [place],  [place] [address]   Chief complaint:  Blood in stool, bloated  HPI: 51 year old very pleasant female with history of multinodular goiter, subclinical hypothyroidism here for new patient visit with complaints of blood mixed in stool.  She had an episode last year that self resolved and she again had 1 more episode in April of this year.  Denies any diarrhea, constipation or excessive straining with bowel movements.  The blood is mixed in stool and is not just when she wipes.  She has generalized abdominal bloating and complaints of right upper quadrant abdominal discomfort.  She has severe vitamin B12 and vitamin D deficiency No unintentional weight loss or decreased appetite  Family history positive for Crohn's disease in her maternal uncle and her sister has lupus   She never had EGD Colonoscopy 02/21/20: By Dr Juanita Craver, was normal   Outpatient Encounter Medications as of 02/21/2022  Medication Sig   albuterol (VENTOLIN HFA) 108 (90 Base) MCG/ACT inhaler Inhale 2 puffs into the lungs every 6 (six) hours as needed for wheezing or shortness of breath. (Patient not taking: Reported on 12/05/2021)   cyclobenzaprine (FLEXERIL) 5 MG tablet Take up to 3 times a day as needed for symptoms related to sciatica. (Patient not taking: Reported on 12/05/2021)   fluticasone (FLONASE) 50 MCG/ACT nasal spray Place 1 spray into both nostrils daily.   No facility-administered encounter medications on file as of 02/21/2022.    Allergies as of 02/21/2022   (No Known Allergies)    Past Medical History:  Diagnosis Date   Asthma    Blood in stool    Migraine    Thyroid disease    UTI (urinary tract infection)     No past surgical history on file.  Family History  Problem Relation Age of Onset   Cancer Mother     Diabetes Mother    Depression Father    Diabetes Father    Arthritis Sister    Depression Sister    Arthritis Maternal Grandmother    Diabetes Maternal Grandmother    COPD Maternal Grandfather    Early death Maternal Grandfather    Diabetes Paternal Grandmother    Depression Paternal Grandmother    Arthritis Paternal Grandmother    Thyroid disease Neg Hx     Social History   Socioeconomic History   Marital status: Married    Spouse name: Not on file   Number of children: Not on file   Years of education: Not on file   Highest education level: Not on file  Occupational History   Not on file  Tobacco Use   Smoking status: Never   Smokeless tobacco: Never  Vaping Use   Vaping Use: Never used  Substance and Sexual Activity   Alcohol use: Not Currently   Drug use: Never   Sexual activity: Yes  Other Topics Concern   Not on file  Social History Narrative   Not on file   Social Determinants of Health   Financial Resource Strain: Not on file  Food Insecurity: Not on file  Transportation Needs: Not on file  Physical Activity: Not on file  Stress: Not on file  Social Connections: Not on file  Intimate Partner Violence: Not on file      Review of  systems: All other review of systems negative except as mentioned in the HPI.   Physical Exam: Vitals:   02/21/22 1002  BP: 114/66  Pulse: 70   Body mass index is 39.86 kg/m. Gen:      No acute distress HEENT:  sclera anicteric Abd:      soft, non-tender; no palpable masses, no distension Ext:    No edema Neuro: alert and oriented x 3 Psych: normal mood and affect  Data Reviewed:  Reviewed labs, radiology imaging, old records and pertinent past GI work up   Assessment and Plan/Recommendations:  51 year old very pleasant female with complaints of blood in stool.  Family history positive for lupus and Crohn's disease She has right upper quadrant abdominal pain, bloating and intermittent reflux symptoms We  will plan to proceed with EGD and colonoscopy for further evaluation Gastroduodenal ulcer, celiac disease and Crohn's in the differential. The risks and benefits as well as alternatives of endoscopic procedure(s) have been discussed and reviewed. All questions answered. The patient agrees to proceed.  Check CBC, CMP, TTG IgA antibody to exclude celiac disease She has severe vitamin D and B12 deficiency.  We will check vitamin D, B12, folate and iron panel  Further recommendations based on EGD and colonoscopy findings  Return after EGD and colonoscopy   The patient was provided an opportunity to ask questions and all were answered. The patient agreed with the plan and demonstrated an understanding of the instructions.  Damaris Hippo , MD    CC: Billie Ruddy, MD

## 2022-02-23 LAB — TISSUE TRANSGLUTAMINASE, IGA: (tTG) Ab, IgA: <1 U/mL

## 2022-02-23 LAB — IGA: Immunoglobulin A: 269 mg/dL (ref 47–310)

## 2022-03-28 ENCOUNTER — Encounter: Payer: Self-pay | Admitting: Gastroenterology

## 2022-04-04 ENCOUNTER — Encounter: Payer: Self-pay | Admitting: Gastroenterology

## 2022-04-04 ENCOUNTER — Ambulatory Visit (AMBULATORY_SURGERY_CENTER): Payer: 59 | Admitting: Gastroenterology

## 2022-04-04 VITALS — BP 115/74 | HR 62 | Temp 97.5°F | Resp 16 | Ht 63.0 in | Wt 225.0 lb

## 2022-04-04 DIAGNOSIS — D122 Benign neoplasm of ascending colon: Secondary | ICD-10-CM

## 2022-04-04 DIAGNOSIS — K649 Unspecified hemorrhoids: Secondary | ICD-10-CM | POA: Diagnosis not present

## 2022-04-04 DIAGNOSIS — K921 Melena: Secondary | ICD-10-CM

## 2022-04-04 DIAGNOSIS — R195 Other fecal abnormalities: Secondary | ICD-10-CM | POA: Diagnosis not present

## 2022-04-04 DIAGNOSIS — R140 Abdominal distension (gaseous): Secondary | ICD-10-CM

## 2022-04-04 MED ORDER — SODIUM CHLORIDE 0.9 % IV SOLN: 500.0000 mL | Freq: Once | INTRAVENOUS | Status: DC

## 2022-04-04 NOTE — Progress Notes (Signed)
Called to room to assist during endoscopic procedure.  Patient ID and intended procedure confirmed with present staff. Received instructions for my participation in the procedure from the performing physician.  

## 2022-04-04 NOTE — Patient Instructions (Signed)
Read all of the handouts given to you by your recovery room nurse.  YOU HAD AN ENDOSCOPIC PROCEDURE TODAY AT THE [facility]:   Refer to the procedure report that was given to you for any specific questions about what was found during the examination.  If the procedure report does not answer your questions, please call your gastroenterologist to clarify.  If you requested that your care partner not be given the details of your procedure findings, then the procedure report has been included in a sealed envelope for you to review at your convenience later.  YOU SHOULD EXPECT: Some feelings of bloating in the abdomen. Passage of more gas than usual.  Walking can help get rid of the air that was put into your GI tract during the procedure and reduce the bloating. If you had a lower endoscopy (such as a colonoscopy or flexible sigmoidoscopy) you may notice spotting of blood in your stool or on the toilet paper. If you underwent a bowel prep for your procedure, you may not have a normal bowel movement for a few days.  Please Note:  You might notice some irritation and congestion in your nose or some drainage.  This is from the oxygen used during your procedure.  There is no need for concern and it should clear up in a day or so.  SYMPTOMS TO REPORT IMMEDIATELY:  Following lower endoscopy (colonoscopy or flexible sigmoidoscopy):  Excessive amounts of blood in the stool  Significant tenderness or worsening of abdominal pains  Swelling of the abdomen that is new, acute  Fever of 100F or higher   For urgent or emergent issues, a gastroenterologist can be reached at any hour by calling [phone]. Do not use MyChart messaging for urgent concerns.    DIET:  We do recommend a small meal at first, but then you may proceed to your regular diet.  Drink plenty of fluids but you should avoid alcoholic beverages for 24 hours.  ACTIVITY:  You should plan to take it easy for the rest of today and  you should NOT DRIVE or use heavy machinery until tomorrow (because of the sedation medicines used during the test).    FOLLOW UP: Our staff will call the number listed on your records the next business day following your procedure.  We will call around 7:15- 8:00 am to check on you and address any questions or concerns that you may have regarding the information given to you following your procedure. If we do not reach you, we will leave a message.  If you develop any symptoms (ie: fever, flu-like symptoms, shortness of breath, cough etc.) before then, please call [phone].  If you test positive for Covid 19 in the 2 weeks post procedure, please call and report this information to us.    If any biopsies were taken you will be contacted by phone or by letter within the next 1-3 weeks.  Please call us at [phone] if you have not heard about the biopsies in 3 weeks.    SIGNATURES/CONFIDENTIALITY: You and/or your care partner have signed paperwork which will be entered into your electronic medical record.  These signatures attest to the fact that that the information above on your After Visit Summary has been reviewed and is understood.  Full responsibility of the confidentiality of this discharge information lies with you and/or your care-partner.  

## 2022-04-04 NOTE — Op Note (Signed)
[facility] Patient Name: Renee Day Procedure Date: 04/04/2022 11:23 AM MRN: [id number] Endoscopist: Mauri Pole , MD Age: 51 Referring MD:  Date of Birth: Jul 01, 1971 Gender: Female Account #: [id number] Procedure:                Upper GI endoscopy Indications:              Gastrointestinal bleeding of unknown origin Medicines:                Monitored Anesthesia Care Procedure:                Pre-Anesthesia Assessment:                           - Prior to the procedure, a History and Physical                            was performed, and patient medications and                            allergies were reviewed. The patient's tolerance of                            previous anesthesia was also reviewed. The risks                            and benefits of the procedure and the sedation                            options and risks were discussed with the patient.                            All questions were answered, and informed consent                            was obtained. Prior Anticoagulants: The patient has                            taken no previous anticoagulant or antiplatelet                            agents. ASA Grade Assessment: II - A patient with                            mild systemic disease. After reviewing the risks                            and benefits, the patient was deemed in                            satisfactory condition to undergo the procedure.                           After obtaining informed consent, the endoscope was  passed under direct vision. Throughout the                            procedure, the patient's blood pressure, pulse, and                            oxygen saturations were monitored continuously. The                            GIF [id number] was introduced through the                            mouth, and advanced to the second part of duodenum.                            The  upper GI endoscopy was accomplished without                            difficulty. The patient tolerated the procedure                            well. Scope In: Scope Out: Findings:                 The Z-line was regular and was found 38 cm from the                            incisors.                           The exam of the esophagus was otherwise normal.                           The cardia and gastric fundus were normal on                            retroflexion.                           The stomach was normal.                           The examined duodenum was normal. Complications:            No immediate complications. Estimated Blood Loss:     Estimated blood loss was minimal. Impression:               - Z-line regular, 38 cm from the incisors.                           - Normal stomach.                           - Normal examined duodenum.                           - No specimens collected. Recommendation:           -  Patient has a contact number available for                            emergencies. The signs and symptoms of potential                            delayed complications were discussed with the                            patient. Return to normal activities tomorrow.                            Written discharge instructions were provided to the                            patient.                           - Resume previous diet.                           - Continue present medications.                           - See the other procedure note for documentation of                            additional recommendations. Mauri Pole, MD 04/04/2022 12:08:44 PM This report has been signed electronically.

## 2022-04-04 NOTE — Op Note (Signed)
[facility] Patient Name: Renee Day Procedure Date: 04/04/2022 11:22 AM MRN: [id number] Endoscopist: Mauri Pole , MD Age: 51 Referring MD:  Date of Birth: Sep 23, 1970 Gender: Female Account #: [id number] Procedure:                Colonoscopy Indications:              Evaluation of unexplained GI bleeding presenting                            with fecal occult blood Medicines:                Monitored Anesthesia Care Procedure:                Pre-Anesthesia Assessment:                           - Prior to the procedure, a History and Physical                            was performed, and patient medications and                            allergies were reviewed. The patient's tolerance of                            previous anesthesia was also reviewed. The risks                            and benefits of the procedure and the sedation                            options and risks were discussed with the patient.                            All questions were answered, and informed consent                            was obtained. Prior Anticoagulants: The patient has                            taken no previous anticoagulant or antiplatelet                            agents. ASA Grade Assessment: II - A patient with                            mild systemic disease. After reviewing the risks                            and benefits, the patient was deemed in                            satisfactory condition to undergo the procedure.  After obtaining informed consent, the colonoscope                            was passed under direct vision. Throughout the                            procedure, the patient's blood pressure, pulse, and                            oxygen saturations were monitored continuously. The                            Olympus [id number]-[id number] (#[id number]) Colonoscope was                            introduced through the anus and  advanced to the the                            cecum, identified by appendiceal orifice and                            ileocecal valve. The colonoscopy was performed                            without difficulty. The patient tolerated the                            procedure well. The quality of the bowel                            preparation was good. The ileocecal valve,                            appendiceal orifice, and rectum were photographed. Scope In: 11:35:27 AM Scope Out: 11:47:31 AM Scope Withdrawal Time: 0 hours 9 minutes 17 seconds  Total Procedure Duration: 0 hours 12 minutes 4 seconds  Findings:                 The perianal and digital rectal examinations were                            normal.                           Two sessile polyps were found in the ascending                            colon. The polyps were 4 to 6 mm in size. These                            polyps were removed with a cold snare. Resection                            and retrieval were complete.  Non-bleeding external and internal hemorrhoids were                            found during retroflexion. The hemorrhoids were                            small. Complications:            No immediate complications. Estimated Blood Loss:     Estimated blood loss was minimal. Impression:               - Two 4 to 6 mm polyps in the ascending colon,                            removed with a cold snare. Resected and retrieved.                           - Non-bleeding external and internal hemorrhoids. Recommendation:           - Patient has a contact number available for                            emergencies. The signs and symptoms of potential                            delayed complications were discussed with the                            patient. Return to normal activities tomorrow.                            Written discharge instructions were provided to the                             patient.                           - Resume previous diet.                           - Continue present medications.                           - Await pathology results.                           - Repeat colonoscopy in 5-10 years for surveillance                            based on pathology results.                           - To visualize the small bowel, perform video                            capsule endoscopy at appointment to be scheduled. Mauri Pole, MD 04/04/2022 12:12:15 PM This  report has been signed electronically.

## 2022-04-04 NOTE — Progress Notes (Signed)
A and O x3. Report to RN. Tolerated MAC anesthesia well.Teeth unchanged after procedure. 

## 2022-04-04 NOTE — Progress Notes (Signed)
Renee Day History and Physical   Primary Care Physician:  Billie Ruddy, MD   Reason for Procedure:  Heme positive stool  Plan:    EGD and colonoscopy with possible interventions as needed     HPI: Renee Day is a very pleasant 50 y.o. female here for EGD and colonoscopy for evaluation of heme positive stool. CRP and ESR elevated , will need to exclude IBD Please refer to office visit note 02/21/22 for details  The risks and benefits as well as alternatives of endoscopic procedure(s) have been discussed and reviewed. All questions answered. The patient agrees to proceed.    Past Medical History:  Diagnosis Date   Asthma    Blood in stool    Migraine    Thyroid disease    UTI (urinary tract infection)     Past Surgical History:  Procedure Laterality Date   COLONOSCOPY      Prior to Admission medications   Medication Sig Start Date End Date Taking? Authorizing Provider  albuterol (VENTOLIN HFA) 108 (90 Base) MCG/ACT inhaler Inhale 2 puffs into the lungs every 6 (six) hours as needed for wheezing or shortness of breath. Patient not taking: Reported on 04/04/2022 03/27/20   Billie Ruddy, MD  Cholecalciferol (D3-50) 1.25 MG (50000 UT) capsule Take 50,000 Units by mouth daily.    [provider]  cyanocobalamin 1000 MCG tablet Take 1,000 mcg by mouth daily.    [provider]  cyclobenzaprine (FLEXERIL) 5 MG tablet Take up to 3 times a day as needed for symptoms related to sciatica. Patient not taking: Reported on 04/04/2022 06/15/21   Billie Ruddy, MD  fluticasone [facility]) 50 MCG/ACT nasal spray Place 1 spray into both nostrils daily. 10/08/21   Billie Ruddy, MD  Multiple Vitamins-Minerals (VITAMIN D3 COMPLETE PO) Take 1 capsule by mouth every 7 (seven) days. Patient not taking: Reported on 04/04/2022    [provider]    Current Outpatient Medications  Medication Sig Dispense Refill   albuterol (VENTOLIN HFA) 108  (90 Base) MCG/ACT inhaler Inhale 2 puffs into the lungs every 6 (six) hours as needed for wheezing or shortness of breath. (Patient not taking: Reported on 04/04/2022) 8 g 3   Cholecalciferol (D3-50) 1.25 MG (50000 UT) capsule Take 50,000 Units by mouth daily.     cyanocobalamin 1000 MCG tablet Take 1,000 mcg by mouth daily.     cyclobenzaprine (FLEXERIL) 5 MG tablet Take up to 3 times a day as needed for symptoms related to sciatica. (Patient not taking: Reported on 04/04/2022) 30 tablet 1   fluticasone (FLONASE) 50 MCG/ACT nasal spray Place 1 spray into both nostrils daily. 16 g 6   Multiple Vitamins-Minerals (VITAMIN D3 COMPLETE PO) Take 1 capsule by mouth every 7 (seven) days. (Patient not taking: Reported on 04/04/2022)     Current Facility-Administered Medications  Medication Dose Route Frequency Provider Last Rate Last Admin   0.9 %  sodium chloride infusion  500 mL Intravenous Once Mauri Pole, MD        Allergies as of 04/04/2022 - Review Complete 04/04/2022  Allergen Reaction Noted   Latex Itching 04/04/2022    Family History  Problem Relation Age of Onset   Cancer Mother    Diabetes Mother    Depression Father    Diabetes Father    Arthritis Sister    Depression Sister    Arthritis Maternal Grandmother    Diabetes Maternal Grandmother    Esophageal cancer  Maternal Grandfather    COPD Maternal Grandfather    Early death Maternal Grandfather    Diabetes Paternal Grandmother    Depression Paternal Grandmother    Arthritis Paternal Grandmother    Thyroid disease Neg Hx    Colon cancer Neg Hx    Stomach cancer Neg Hx     Social History   Socioeconomic History   Marital status: Married    Spouse name: Not on file   Number of children: Not on file   Years of education: Not on file   Highest education level: Not on file  Occupational History   Not on file  Tobacco Use   Smoking status: Never   Smokeless tobacco: Never  Vaping Use   Vaping Use: Never used   Substance and Sexual Activity   Alcohol use: Not Currently   Drug use: Never   Sexual activity: Yes  Other Topics Concern   Not on file  Social History Narrative   Not on file   Social Determinants of Health   Financial Resource Strain: Not on file  Food Insecurity: Not on file  Transportation Needs: Not on file  Physical Activity: Not on file  Stress: Not on file  Social Connections: Not on file  Intimate Partner Violence: Not on file    Review of Systems:  All other review of systems negative except as mentioned in the HPI.  Physical Exam: Vital signs in last 24 hours: BP 103/69   Pulse 65   Temp (!) 97.5 F (36.4 C)   Ht 5' 3"  (1.6 m)   Wt 225 lb (102.1 kg)   SpO2 100%   BMI 39.86 kg/m  General:   Alert, NAD Lungs:  Clear .   Heart:  Regular rate and rhythm Abdomen:  Soft, nontender and nondistended. Neuro/Psych:  Alert and cooperative. Normal mood and affect. A and O x 3  Reviewed labs, radiology imaging, old records and pertinent past GI work up  Patient is appropriate for planned procedure(s) and anesthesia in an ambulatory setting   K. Denzil Magnuson , MD [phone]

## 2022-04-05 ENCOUNTER — Telehealth: Payer: Self-pay | Admitting: *Deleted

## 2022-04-05 NOTE — Telephone Encounter (Signed)
  Follow up Call-     04/04/2022   10:49 AM  Call back number  Post procedure Call Back phone  # [phone]  Permission to leave phone message Yes     Patient questions:  Do you have a fever, pain , or abdominal swelling? No. Pain Score  0 *  Have you tolerated food without any problems? Yes.    Have you been able to return to your normal activities? Yes.    Do you have any questions about your discharge instructions: Diet   No. Medications  No. Follow up visit  No.  Do you have questions or concerns about your Care? No.  Actions: * If pain score is 4 or above: No action needed, pain <4.

## 2022-04-08 ENCOUNTER — Ambulatory Visit: Payer: 59 | Admitting: Family Medicine

## 2022-04-08 VITALS — BP 108/80 | HR 68 | Temp 98.7°F | Wt 220.0 lb

## 2022-04-08 DIAGNOSIS — J45990 Exercise induced bronchospasm: Secondary | ICD-10-CM

## 2022-04-08 DIAGNOSIS — R7982 Elevated C-reactive protein (CRP): Secondary | ICD-10-CM

## 2022-04-08 DIAGNOSIS — R600 Localized edema: Secondary | ICD-10-CM | POA: Diagnosis not present

## 2022-04-08 DIAGNOSIS — M25672 Stiffness of left ankle, not elsewhere classified: Secondary | ICD-10-CM

## 2022-04-08 MED ORDER — ALBUTEROL SULFATE HFA 108 (90 BASE) MCG/ACT IN AERS: 2.0000 | INHALATION_SPRAY | Freq: Four times a day (QID) | RESPIRATORY_TRACT | 5 refills | Status: DC | PRN

## 2022-04-08 NOTE — Progress Notes (Signed)
Subjective:    Patient ID: Renee Day, female    DOB: 1971-01-27, 51 y.o.   MRN: [id number]  Chief Complaint  Patient presents with   Medication Refill    Inhaler    Results    Test results form last lab draw showed inflammation. Would like to discuss     HPI Patient was seen today for f/u and med refill.  Pt requesting refill on albuterol as inhaler has expired.  Noticed wheezing while walking for exercise.   Pt seen by GI for blood in stoool.  Had largely normal EGD and colonoscopy 04/05/22.  2 polyps removed.  Pt also had labs which showed elevated CRP, 6.120.  in the past pt has had negative autoimmune work up.  Pt endorses recent joint pain after eating a hamburger.  Denies h/o lyme dz. also notes left ankle edema without pain.  Wearing supportive shoes with good arch support.  Patient endorses frequent travel out of state for her job. . Past Medical History:  Diagnosis Date   Asthma    Blood in stool    Migraine    Thyroid disease    UTI (urinary tract infection)     Allergies  Allergen Reactions   Latex Itching    ROS General: Denies fever, chills, night sweats, changes in weight, changes in appetite HEENT: Denies headaches, ear pain, changes in vision, rhinorrhea, sore throat CV: Denies CP, palpitations, SOB, orthopnea  +LE edema L>R Pulm: Denies SOB, cough, wheezing GI: Denies abdominal pain, nausea, vomiting, diarrhea, constipation GU: Denies dysuria, hematuria, frequency, vaginal discharge Msk: Denies muscle cramps, joint pains  +l ankle stiffness Neuro: Denies weakness, numbness, tingling Skin: Denies rashes, bruising Psych: Denies depression, anxiety, hallucinations    Objective:    Blood pressure 108/80, pulse 68, temperature 98.7 F (37.1 C), temperature source Oral, weight 220 lb (99.8 kg), SpO2 98 %.  Gen. Pleasant, well-nourished, in no distress, normal affect   HEENT: [place]/AT, face symmetric, conjunctiva clear, no scleral icterus, PERRLA, EOMI,  nares patent without drainage Lungs: no accessory muscle use, CTAB, no wheezes or rales Cardiovascular: RRR, no m/r/g, trace edema of LLE to mid shin.  No calf tenderness b/l. Musculoskeletal: Stiffness in L ankle with plantar flexion, dorsiflexion, eversion and inversion.  No deformities, no cyanosis or clubbing, normal tone Neuro:  A&Ox3, CN II-XII intact, normal gait Skin:  Warm, no lesions/ rash   Wt Readings from Last 3 Encounters:  04/08/22 220 lb (99.8 kg)  04/04/22 225 lb (102.1 kg)  02/21/22 225 lb (102.1 kg)    Lab Results  Component Value Date   WBC 9.1 02/21/2022   HGB 13.2 02/21/2022   HCT 41.0 02/21/2022   PLT 244.0 02/21/2022   GLUCOSE 85 02/21/2022   CHOL 178 06/15/2021   TRIG 98.0 06/15/2021   HDL 44.10 06/15/2021   LDLCALC 115 (H) 06/15/2021   ALT 13 02/21/2022   AST 14 02/21/2022   NA 137 02/21/2022   K 4.0 02/21/2022   CL 102 02/21/2022   CREATININE 0.71 02/21/2022   BUN 9 02/21/2022   CO2 29 02/21/2022   TSH 0.43 12/05/2021   HGBA1C 5.5 06/15/2021    Assessment/Plan:  Exercise-induced asthma -Discussed avoiding triggers.  -Can use albuterol prior to exercise/increased physical activity -Control allergy symptoms also trigger asthma.  - Plan: albuterol (VENTOLIN HFA) 108 (90 Base) MCG/ACT inhaler  Edema of left lower leg -Discussed possible causes including increased travel, increased sodium intake, prior injury -Current symptoms likely 2/2 history  of moderate plantar calcaneal spur as noted on x-ray from 02/19/2021 -Supportive care including elevation, TED hose/compression socks -Patient encouraged to stretch and do ankle exercises to help decrease stiffness and joint  Stiffness of left ankle joint -Supportive care including stretching, Tylenol/NSAIDs, supportive shoes, topical analgesics -Discussed ankle exercises -For continued worsening symptoms follow-up with podiatry  Elevated C-reactive protein (CRP) -Discussed possible causes for  elevation in CRP, a nonspecific marker of inflammation  F/u as needed  Grier Mitts, MD

## 2022-04-12 ENCOUNTER — Other Ambulatory Visit: Payer: Self-pay

## 2022-04-12 ENCOUNTER — Telehealth: Payer: Self-pay

## 2022-04-12 DIAGNOSIS — K921 Melena: Secondary | ICD-10-CM

## 2022-04-12 DIAGNOSIS — K922 Gastrointestinal hemorrhage, unspecified: Secondary | ICD-10-CM

## 2022-04-12 NOTE — Telephone Encounter (Signed)
Spoke with the patient about capsule endoscopy. Reviewed the prep, the procedure and and the days after the procedure.  Questions invited and answered. Patient chooses 05/01/22 as her procedure date. She will review the written instructions in her My Chart; call with any questions.

## 2022-04-23 ENCOUNTER — Encounter: Payer: Self-pay | Admitting: Family Medicine

## 2022-04-30 NOTE — Telephone Encounter (Signed)
Patient called with additional questions regarding the capsule.

## 2022-05-01 ENCOUNTER — Encounter: Payer: Self-pay | Admitting: Gastroenterology

## 2022-05-01 ENCOUNTER — Ambulatory Visit (INDEPENDENT_AMBULATORY_CARE_PROVIDER_SITE_OTHER): Payer: 59 | Admitting: Gastroenterology

## 2022-05-01 DIAGNOSIS — D500 Iron deficiency anemia secondary to blood loss (chronic): Secondary | ICD-10-CM | POA: Diagnosis not present

## 2022-05-01 DIAGNOSIS — R195 Other fecal abnormalities: Secondary | ICD-10-CM | POA: Diagnosis not present

## 2022-05-01 NOTE — Patient Instructions (Incomplete)
  The capsule endoscopy procedure will last approximately 8 hours. Contact your doctor's office immediately if you suffer from any abdominal pain, nausea or vomiting during the procedure. 1. You may drink colorless liquids starting 2 hours after swallowing the capsule.  2. You may have a light snack  4 hours after ingestion. After the examination is completed you may return to your normal diet.  3. Check the blue flashing DataRecorder light a couple of times through the day. If is stops blinking or changes color, note the time and contact your doctor.  4. Avoid strong electromagnetic fields such as MRI devices or ham radios after swallowing the capsule and until you pass it in a bowel movement.  5. Do not disconnect the equipment or completely remove the DataRecorder at any time during the procedure.  6. Treat the DataRecorder carefully. Avoid sudden movements and banging the DataRecorder.  7. Avoid direct exposure to bright sunlight.  8. Return to the office at 4:30 pm to have the recording equipment removed.  Clear Liquid Diet Examples: Black Coffee (non-dairy creamer ok) Jell-O (NO fruit added & NO red Jell-o) Water Bouillon (Chicken or Beef) 7-up Cranberry Juice Apple Juice Popsicles (NO red) Tea Coke Sprite Pepsi Ginger Ale Gatorade Mt Dew Dr Barnie Mort Snack Examples: Soup Cereal 1/2 Sandwich Salad Eggs Potatoes Toast Rice

## 2022-05-01 NOTE — Telephone Encounter (Signed)
Called the patient. Capsule endoscopy is scheduled for today. No answer.

## 2022-05-01 NOTE — Progress Notes (Signed)
CAPSULE ID: [id number]  Exp: 2023-08-19 LOT: [id number]  Patient arrived for capsule endoscopy. Accompanied by spouse. Reported the prep went well. Confirmed patient is fasting. Explained dietary restrictions for the next few hours. Patient verbalized understanding. Opened capsule, ensured capsule was flashing and transmitting to the recorder prior to the patient swallowing the capsule. Patient swallowed capsule without difficulty. Patient told to call the office with any questions. Understands to return to the office today between 4:00 and 4:30 pm. No further questions by the conclusion of the visit.

## 2022-05-02 ENCOUNTER — Encounter: Payer: Self-pay | Admitting: Gastroenterology

## 2022-05-09 ENCOUNTER — Encounter: Payer: Self-pay | Admitting: Gastroenterology

## 2022-05-24 ENCOUNTER — Telehealth: Payer: Self-pay

## 2022-05-24 NOTE — Telephone Encounter (Signed)
Called the patient to review findings. No answer Left a message to call back when available.

## 2022-05-27 ENCOUNTER — Other Ambulatory Visit: Payer: Self-pay

## 2022-05-27 DIAGNOSIS — Z189 Retained foreign body fragments, unspecified material: Secondary | ICD-10-CM

## 2022-05-27 NOTE — Telephone Encounter (Signed)
Patient has not seen the capsule from her study in her stool. She will come for an abdominal xray 1 view.

## 2022-05-28 ENCOUNTER — Ambulatory Visit (INDEPENDENT_AMBULATORY_CARE_PROVIDER_SITE_OTHER)
Admission: RE | Admit: 2022-05-28 | Discharge: 2022-05-28 | Disposition: A | Discharge status: Home / Self Care | Payer: 59 | Source: Ambulatory Visit | Attending: Gastroenterology | Admitting: Gastroenterology

## 2022-05-28 DIAGNOSIS — Z189 Retained foreign body fragments, unspecified material: Secondary | ICD-10-CM | POA: Diagnosis not present

## 2022-06-17 ENCOUNTER — Encounter: Payer: 59 | Admitting: Family Medicine

## 2022-06-19 ENCOUNTER — Ambulatory Visit (INDEPENDENT_AMBULATORY_CARE_PROVIDER_SITE_OTHER): Payer: 59 | Admitting: Family Medicine

## 2022-06-19 VITALS — BP 122/82 | HR 70 | Temp 99.0°F | Ht 63.0 in | Wt 217.2 lb

## 2022-06-19 DIAGNOSIS — E042 Nontoxic multinodular goiter: Secondary | ICD-10-CM

## 2022-06-19 DIAGNOSIS — H6591 Unspecified nonsuppurative otitis media, right ear: Secondary | ICD-10-CM | POA: Diagnosis not present

## 2022-06-19 DIAGNOSIS — M5432 Sciatica, left side: Secondary | ICD-10-CM

## 2022-06-19 DIAGNOSIS — Z0000 Encounter for general adult medical examination without abnormal findings: Secondary | ICD-10-CM

## 2022-06-19 MED ORDER — AMOXICILLIN 500 MG PO TABS: 500.0000 mg | ORAL_TABLET | Freq: Two times a day (BID) | ORAL | 0 refills | Status: AC

## 2022-06-19 NOTE — Progress Notes (Signed)
Subjective:     Renee Day is a 51 y.o. female and is here for a comprehensive physical exam.  Patient doing well overall.  Having issues with L-sided sciatica.  Tried msk relaxer.  Has foster parent form for completion.  Pt unsure if thyroid has increased in size.  Seen by Endo.  Social History   Socioeconomic History   Marital status: Married    Spouse name: Not on file   Number of children: Not on file   Years of education: Not on file   Highest education level: Master's degree (e.g., MA, MS, MEng, MEd, MSW, MBA)  Occupational History   Not on file  Tobacco Use   Smoking status: Never   Smokeless tobacco: Never  Vaping Use   Vaping Use: Never used  Substance and Sexual Activity   Alcohol use: Not Currently   Drug use: Never   Sexual activity: Yes  Other Topics Concern   Not on file  Social History Narrative   Not on file   Social Determinants of Health   Financial Resource Strain: Low Risk  (04/08/2022)   Overall Financial Resource Strain (CARDIA)    Difficulty of Paying Living Expenses: Not hard at all  Food Insecurity: No Food Insecurity (04/08/2022)   Hunger Vital Sign    Worried About Running Out of Food in the Last Year: Never true    [place] in the Last Year: Never true  Transportation Needs: No Transportation Needs (04/08/2022)   PRAPARE - Hydrologist (Medical): No    Lack of Transportation (Non-Medical): No  Physical Activity: Insufficiently Active (04/08/2022)   Exercise Vital Sign    Days of Exercise per Week: 3 days    Minutes of Exercise per Session: 30 min  Stress: No Stress Concern Present (04/08/2022)   [place]    Feeling of Stress : Only a little  Social Connections: Socially Integrated (04/08/2022)   Social Connection and Isolation Panel [NHANES]    Frequency of Communication with Friends and Family: More than three times a week    Frequency of  Social Gatherings with Friends and Family: Twice a week    Attends Religious Services: More than 4 times per year    Active Member of Clubs or Organizations: Yes    Attends Music therapist: More than 4 times per year    Marital Status: Married  Human resources officer Violence: Not on file   Health Maintenance  Topic Date Due   HIV Screening  Never done   Hepatitis C Screening  Never done   PAP SMEAR-Modifier  04/29/2020   COVID-19 Vaccine (5 - Pfizer risk series) 08/22/2021   INFLUENZA VACCINE  12/01/2022 (Originally 04/02/2022)   TETANUS/TDAP  05/13/2027   COLONOSCOPY (Pts 45-75yr Insurance coverage will need to be confirmed)  04/04/2029   HPV VACCINES  Aged Out    The following portions of the patient's history were reviewed and updated as appropriate: allergies, current medications, past family history, past medical history, past social history, past surgical history, and problem list.  Review of Systems Pertinent items noted in HPI and remainder of comprehensive ROS otherwise negative.   Objective:    BP 122/82 (BP Location: Right Arm, Patient Position: Sitting, Cuff Size: Large)   Pulse 70   Temp 99 F (37.2 C) (Oral)   Ht '5\' 3"'$  (1.6 m)   Wt 217 lb 3.2 oz (98.5 kg)  LMP 06/16/2022   SpO2 97%   BMI 38.48 kg/m  General appearance: alert, cooperative, and no distress Head: Normocephalic, without obvious abnormality, atraumatic Eyes: conjunctivae/corneas clear. PERRL, EOM's intact. Fundi benign. Ears:  Left TM normal.  Right TM with effusion. Nose: Nares normal. Septum midline. Mucosa normal. No drainage or sinus tenderness. Throat: lips, mucosa, and tongue normal; teeth and gums normal Neck: no adenopathy, no carotid bruit, no JVD, supple, symmetrical, trachea midline, and thyroid enlarged, R>L, no tenderness/mass/nodules Lungs: clear to auscultation bilaterally Heart: regular rate and rhythm, S1, S2 normal, no murmur, click, rub or gallop Abdomen: soft,  non-tender; bowel sounds normal; no masses,  no organomegaly Extremities: extremities normal, atraumatic, no cyanosis or edema Pulses: 2+ and symmetric Skin: Skin color, texture, turgor normal. No rashes or lesions Lymph nodes: Cervical, supraclavicular, and axillary nodes normal. Neurologic: Alert and oriented X 3, normal strength and tone. Normal symmetric reflexes. Normal coordination and gait    Assessment:    Healthy female exam.      Plan:    Anticipatory guidance given including wearing seatbelts, smoke detectors in the home, increasing physical activity, increasing p.o. intake of water and vegetables. -labs -mammogram done -pap up to date with OB/Gyn -form for foster care completed See After Visit Summary for Counseling Recommendations  Well adult exam  - Plan: CBC with Differential/Platelet, Basic metabolic panel, Hemoglobin A1c, Lipid panel  Nontoxic multinodular goiter -stable -continue f/u with Endo prn  - Plan: TSH, T4, Free, T3, Free  Sciatica of left side -continue stretching -msk rlxr prn -ergonomic workspace modifications -continue to monitor  Right otitis media with effusion  -asymptomatic -pt advised to use flonase consistently for the next few days -wait and see rx for amoxicillin sent to pharmacy for increased or worsened symptoms if needed - Plan: amoxicillin (AMOXIL) 500 MG tablet  F/u prn  Grier Mitts, MD

## 2022-06-20 LAB — T4, FREE: Free T4: 0.93 ng/dL (ref 0.60–1.60)

## 2022-06-20 LAB — BASIC METABOLIC PANEL
BUN: 13 mg/dL (ref 6–23)
CO2: 27 mEq/L (ref 19–32)
Calcium: 9.4 mg/dL (ref 8.4–10.5)
Chloride: 102 mEq/L (ref 96–112)
Creatinine, Ser: 0.76 mg/dL (ref 0.40–1.20)
GFR: 92.23 mL/min (ref >60.00)
Glucose, Bld: 78 mg/dL (ref 70–99)
Potassium: 3.5 mEq/L (ref 3.5–5.1)
Sodium: 137 mEq/L (ref 135–145)

## 2022-06-20 LAB — CBC WITH DIFFERENTIAL/PLATELET
Basophils Absolute: 0.1 10*3/uL (ref 0.0–0.1)
Basophils Relative: 0.8 % (ref 0.0–3.0)
Eosinophils Absolute: 0.1 10*3/uL (ref 0.0–0.7)
Eosinophils Relative: 0.6 % (ref 0.0–5.0)
HCT: 38.7 % (ref 36.0–46.0)
Hemoglobin: 12.4 g/dL (ref 12.0–15.0)
Lymphocytes Relative: 25.4 % (ref 12.0–46.0)
Lymphs Abs: 2.7 10*3/uL (ref 0.7–4.0)
MCHC: 32 g/dL (ref 30.0–36.0)
MCV: 86.8 fl (ref 78.0–100.0)
Monocytes Absolute: 0.6 10*3/uL (ref 0.1–1.0)
Monocytes Relative: 5.5 % (ref 3.0–12.0)
Neutro Abs: 7.2 10*3/uL (ref 1.4–7.7)
Neutrophils Relative %: 67.7 % (ref 43.0–77.0)
Platelets: 234 10*3/uL (ref 150.0–400.0)
RBC: 4.46 Mil/uL (ref 3.87–5.11)
RDW: 13.5 % (ref 11.5–15.5)
WBC: 10.7 10*3/uL — ABNORMAL HIGH (ref 4.0–10.5)

## 2022-06-20 LAB — LIPID PANEL
Cholesterol: 191 mg/dL (ref 0–200)
HDL: 58.2 mg/dL (ref >39.00)
LDL Cholesterol: 122 mg/dL — ABNORMAL HIGH (ref 0–99)
NonHDL: 132.44
Total CHOL/HDL Ratio: 3
Triglycerides: 52 mg/dL (ref 0.0–149.0)
VLDL: 10.4 mg/dL (ref 0.0–40.0)

## 2022-06-20 LAB — HEMOGLOBIN A1C: Hgb A1c MFr Bld: 5.6 % (ref 4.6–6.5)

## 2022-06-20 LAB — T3, FREE: T3, Free: 3 pg/mL (ref 2.3–4.2)

## 2022-06-20 LAB — TSH: TSH: 0.51 u[IU]/mL (ref 0.35–5.50)

## 2022-07-03 ENCOUNTER — Encounter: Payer: Self-pay | Admitting: Family Medicine

## 2022-08-14 ENCOUNTER — Ambulatory Visit: Payer: 59 | Admitting: Gastroenterology

## 2022-08-19 ENCOUNTER — Encounter: Payer: Self-pay | Admitting: Family Medicine

## 2022-08-19 ENCOUNTER — Ambulatory Visit: Payer: 59 | Admitting: Family Medicine

## 2022-08-19 VITALS — BP 122/92 | HR 72 | Temp 98.0°F | Wt 227.6 lb

## 2022-08-19 DIAGNOSIS — R251 Tremor, unspecified: Secondary | ICD-10-CM | POA: Diagnosis not present

## 2022-08-19 DIAGNOSIS — R202 Paresthesia of skin: Secondary | ICD-10-CM

## 2022-08-19 LAB — COMPREHENSIVE METABOLIC PANEL
ALT: 12 U/L (ref 0–35)
AST: 14 U/L (ref 0–37)
Albumin: 3.8 g/dL (ref 3.5–5.2)
Alkaline Phosphatase: 71 U/L (ref 39–117)
BUN: 12 mg/dL (ref 6–23)
CO2: 26 mEq/L (ref 19–32)
Calcium: 9.1 mg/dL (ref 8.4–10.5)
Chloride: 102 mEq/L (ref 96–112)
Creatinine, Ser: 0.72 mg/dL (ref 0.40–1.20)
GFR: 98.3 mL/min (ref >60.00)
Glucose, Bld: 83 mg/dL (ref 70–99)
Potassium: 3.7 mEq/L (ref 3.5–5.1)
Sodium: 136 mEq/L (ref 135–145)
Total Bilirubin: 0.5 mg/dL (ref 0.2–1.2)
Total Protein: 7.5 g/dL (ref 6.0–8.3)

## 2022-08-19 LAB — CBC WITH DIFFERENTIAL/PLATELET
Basophils Absolute: 0 10*3/uL (ref 0.0–0.1)
Basophils Relative: 0.4 % (ref 0.0–3.0)
Eosinophils Absolute: 0.1 10*3/uL (ref 0.0–0.7)
Eosinophils Relative: 1.1 % (ref 0.0–5.0)
HCT: 38 % (ref 36.0–46.0)
Hemoglobin: 12.4 g/dL (ref 12.0–15.0)
Lymphocytes Relative: 26.3 % (ref 12.0–46.0)
Lymphs Abs: 2.4 10*3/uL (ref 0.7–4.0)
MCHC: 32.6 g/dL (ref 30.0–36.0)
MCV: 85.4 fl (ref 78.0–100.0)
Monocytes Absolute: 0.5 10*3/uL (ref 0.1–1.0)
Monocytes Relative: 5.4 % (ref 3.0–12.0)
Neutro Abs: 6.1 10*3/uL (ref 1.4–7.7)
Neutrophils Relative %: 66.8 % (ref 43.0–77.0)
Platelets: 251 10*3/uL (ref 150.0–400.0)
RBC: 4.45 Mil/uL (ref 3.87–5.11)
RDW: 13 % (ref 11.5–15.5)
WBC: 9.2 10*3/uL (ref 4.0–10.5)

## 2022-08-19 LAB — TSH: TSH: 0.53 u[IU]/mL (ref 0.35–5.50)

## 2022-08-19 LAB — VITAMIN B12: Vitamin B-12: 234 pg/mL (ref 211–911)

## 2022-08-19 LAB — FOLATE: Folate: 14 ng/mL (ref >5.9)

## 2022-08-19 NOTE — Progress Notes (Signed)
Subjective:    Patient ID: Renee Day, female    DOB: 09-02-1971, 51 y.o.   MRN: [id number]  Chief Complaint  Patient presents with   Numbness    Pt c/o numbness on hands and feet. Going on for a month.     HPI Patient was seen today for ongoing concern.  Pt endorses numbness/tingling in b/l hands and feet x 1 mo.  R hand symptoms occur more than L.  Can occur at any time.  Pt is R handed.  Denies pain.  Patient notes tremor in left hand when holding phone.  Past Medical History:  Diagnosis Date   Asthma    Blood in stool    Migraine    Thyroid disease    UTI (urinary tract infection)     Allergies  Allergen Reactions   Latex Itching    ROS General: Denies fever, chills, night sweats, changes in weight, changes in appetite HEENT: Denies headaches, ear pain, changes in vision, rhinorrhea, sore throat CV: Denies CP, palpitations, SOB, orthopnea Pulm: Denies SOB, cough, wheezing GI: Denies abdominal pain, nausea, vomiting, diarrhea, constipation GU: Denies dysuria, hematuria, frequency, vaginal discharge Msk: Denies muscle cramps, joint pains Neuro: Denies weakness+ numbness, tingling in bilateral hands and feet, tremor in L hand Skin: Denies rashes, bruising Psych: Denies depression, anxiety, hallucinations     Objective:    Blood pressure (!) 122/92, pulse 72, temperature 98 F (36.7 C), temperature source Oral, weight 227 lb 9.6 oz (103.2 kg), last menstrual period 08/04/2022, SpO2 100 %.   Gen. Pleasant, well-nourished, in no distress, normal affect this HEENT: [place]/AT, face symmetric, conjunctiva clear, no scleral icterus, PERRLA, EOMI, nares patent without drainage Lungs: no accessory muscle use Cardiovascular: RRR, no peripheral edema Musculoskeletal: mild edema of R hand.  Palpation at Guyon's canal causes tingling. Negative Tinnel's and Phalen's.  Axial loading of cervical spine negative.  No deformities, no cyanosis or clubbing, normal tone Neuro:   A&Ox3, CN II-XII intact, normal gait   Wt Readings from Last 3 Encounters:  08/19/22 227 lb 9.6 oz (103.2 kg)  06/19/22 217 lb 3.2 oz (98.5 kg)  04/08/22 220 lb (99.8 kg)    Lab Results  Component Value Date   WBC 10.7 (H) 06/19/2022   HGB 12.4 06/19/2022   HCT 38.7 06/19/2022   PLT 234.0 06/19/2022   GLUCOSE 78 06/19/2022   CHOL 191 06/19/2022   TRIG 52.0 06/19/2022   HDL 58.20 06/19/2022   LDLCALC 122 (H) 06/19/2022   ALT 13 02/21/2022   AST 14 02/21/2022   NA 137 06/19/2022   K 3.5 06/19/2022   CL 102 06/19/2022   CREATININE 0.76 06/19/2022   BUN 13 06/19/2022   CO2 27 06/19/2022   TSH 0.51 06/19/2022   HGBA1C 5.6 06/19/2022    Assessment/Plan:  Paresthesia of hand, bilateral - Plan: Vitamin B12, Folate, CBC with Differential/Platelet, Iron, TIBC and Ferritin Panel, Ambulatory referral to Neurology  Paresthesia of both feet - Plan: Vitamin B12, Folate, CBC with Differential/Platelet, Iron, TIBC and Ferritin Panel, Ambulatory referral to Neurology  Tremor of left hand - Plan: Vitamin B12, CBC with Differential/Platelet, TSH, CMP, Ambulatory referral to Neurology  Discussed possible causes of symptoms including vitamin deficiency, carpal tunnel, Guyon tunnel syndrome, etc.  Obtain labs.  Referral to neurology for EMG/NCS.  Supportive care discussed including wrist splints at night, ergonomic workspace modifications, topical analgesics, Tylenol or NSAIDs.  F/u prn  Grier Mitts, MD

## 2022-08-20 LAB — IRON,TIBC AND FERRITIN PANEL
%SAT: 23 % (calc) (ref 16–45)
Ferritin: 42 ng/mL (ref 16–232)
Iron: 73 ug/dL (ref 40–190)
TIBC: 311 mcg/dL (calc) (ref 250–450)

## 2022-08-28 ENCOUNTER — Ambulatory Visit (INDEPENDENT_AMBULATORY_CARE_PROVIDER_SITE_OTHER): Payer: 59

## 2022-08-28 DIAGNOSIS — E538 Deficiency of other specified B group vitamins: Secondary | ICD-10-CM | POA: Diagnosis not present

## 2022-08-28 MED ORDER — CYANOCOBALAMIN 1000 MCG/ML IJ SOLN
1000.0000 ug | Freq: Once | INTRAMUSCULAR | Status: AC
  Administered 2022-08-28: 1000 ug via INTRAMUSCULAR

## 2022-08-28 NOTE — Patient Instructions (Signed)
Health Maintenance Due  Topic Date Due   HIV Screening  Never done   Hepatitis C Screening  Never done   PAP SMEAR-Modifier  04/29/2020   COVID-19 Vaccine (5 - 2023-24 season) 05/03/2022      Row Labels 08/19/2022   11:33 AM 06/19/2022    2:56 PM 04/08/2022    3:54 PM  Depression screen PHQ 2/9   Section Header. No data exists in this row.     Decreased Interest   0 0 0  Down, Depressed, Hopeless   0 0 0  PHQ - 2 Score   0 0 0  Altered sleeping   0 0 0  Tired, decreased energy   0 1 0  Change in appetite   2 1 0  Feeling bad or failure about yourself    0 0 0  Trouble concentrating   0 0 0  Moving slowly or fidgety/restless   0 0 0  Suicidal thoughts   0 0 0  PHQ-9 Score   2 2 0  Difficult doing work/chores   Not difficult at all Not difficult at all

## 2022-08-28 NOTE — Progress Notes (Addendum)
Per orders of Billie Ruddy, MD, injection of B12 given in  Right deltoid by Franco Collet. Patient tolerated injection well.  Lab Results  Component Value Date   [id number] 08/19/2022

## 2022-09-03 ENCOUNTER — Encounter: Payer: Self-pay | Admitting: Internal Medicine

## 2022-09-03 ENCOUNTER — Ambulatory Visit: Payer: 59 | Admitting: Internal Medicine

## 2022-09-03 VITALS — BP 128/80 | HR 75 | Ht 63.0 in | Wt 227.2 lb

## 2022-09-03 DIAGNOSIS — E0590 Thyrotoxicosis, unspecified without thyrotoxic crisis or storm: Secondary | ICD-10-CM | POA: Diagnosis not present

## 2022-09-03 DIAGNOSIS — E042 Nontoxic multinodular goiter: Secondary | ICD-10-CM

## 2022-09-03 NOTE — Patient Instructions (Signed)
Please return in a year.

## 2022-09-03 NOTE — Progress Notes (Signed)
Patient ID: Renee Day, female   DOB: 1971/01/09, 52 y.o.   MRN: [id number]  HPI  Renee Day is a 52 y.o.-year-old female, returning for follow-up for multinodular goiter and history of thyrotoxicosis.  She previously saw Dr. Loanne Drilling, last visit 8 months ago.  Multinodular goiter: - dx 2009  Thyroid U/S results and records are benign thyroid biopsy from 2009 are not available.  Thyroid Bx repeated 2012: benign result reportedly  Thyroid U/S (07/20/2014): 3 nodules   Thyroid U/S (09/15/2017):    Thyroid U/S (02/28/2021): Pseudonodular thyroid with a R 3.4 x 2.0 x 3.6 cm spongiform nodule Parenchymal Echotexture: Moderately heterogenous  Isthmus: 0.5 cm  Right lobe: 9.4 x 4.4 x 5.8 cm  Left lobe: 6.1 x 1.5 x 2.1 cm  _________________________________________________________   Estimated total number of nodules >/= 1 cm: 1  _________________________________________________________   Nodule # 1: Large spongiform nodule in the right upper gland measures approximately 3.4 x 2.0 x 3.6 cm.   The remaining right thyroid gland is diffusely enlarged and lobular with multiple areas of pseudo nodularity. No additional discrete nodules are identified separate from the background thyroid parenchyma. No suspicious features.   IMPRESSION: Diffusely heterogeneous, lobular and asymmetrically enlarged (right larger than left) thyroid gland.     Large 3.6 cm spongiform nodule in the right upper gland was reportedly previously biopsied. Recommend correlation with prior biopsy results. From a morphologic perspective, a spongiform configuration is typically considered benign/low risk.   No additional discrete nodules or suspicious features identified.  Pt denies: - feeling nodules in neck - hoarseness - dysphagia - choking - SOB with lying down  Thyrotoxicosis: - dx 2021  I reviewed pt's thyroid tests: Lab Results  Component Value Date   TSH 0.53 08/19/2022    TSH 0.51 06/19/2022   TSH 0.43 12/05/2021   TSH 0.18 (L) 06/15/2021   TSH 0.53 01/19/2021   TSH 0.44 10/02/2020   TSH 0.76 06/14/2020   TSH 0.40 06/10/2019   FREET4 0.93 06/19/2022   FREET4 1.01 12/05/2021   FREET4 0.91 06/15/2021   FREET4 0.78 10/02/2020   FREET4 1.1 06/14/2020   FREET4 0.87 06/10/2019    She mentions: - heat intolerance - palpitations  No: - weight loss - insomnia - tremors - diarrhea  + FH of thyroid ds. : cousin with goiter.No FH of thyroid cancer. No h/o radiation tx to head or neck. No recent contrast studies. No steroid use. No herbal supplements. No Biotin supplements or Hair, Skin and Nails vitamins.  Pt also has a history of type 2 diabetes per review of the chart, however, latest HbA1c levels have been within the target range: Lab Results  Component Value Date   HGBA1C 5.6 06/19/2022   HGBA1C 5.5 06/15/2021   HGBA1C 5.4 02/23/2021   HGBA1C 5.4 06/14/2020   HGBA1C 5.5 06/10/2019   She also has a history of vitamin D and B12 deficiency.  ROS:  + See HPI   Past Medical History:  Diagnosis Date   Asthma    Blood in stool    Migraine    Thyroid disease    UTI (urinary tract infection)    Past Surgical History:  Procedure Laterality Date   COLONOSCOPY     Social History   Socioeconomic History   Marital status: Married    Spouse name: Not on file   Number of children: Not on file   Years of education: Not on file   Highest education level: Master's degree (  e.g., MA, MS, MEng, MEd, MSW, MBA)  Occupational History   Not on file  Tobacco Use   Smoking status: Never   Smokeless tobacco: Never  Vaping Use   Vaping Use: Never used  Substance and Sexual Activity   Alcohol use: Not Currently   Drug use: Never   Sexual activity: Yes  Other Topics Concern   Not on file  Social History Narrative   Not on file   Social Determinants of Health   Financial Resource Strain: Low Risk  (04/08/2022)   Overall Financial Resource Strain  (CARDIA)    Difficulty of Paying Living Expenses: Not hard at all  Food Insecurity: No Food Insecurity (04/08/2022)   Hunger Vital Sign    Worried About Running Out of Food in the Last Year: Never true    Ran Out of Food in the Last Year: Never true  Transportation Needs: No Transportation Needs (04/08/2022)   PRAPARE - Hydrologist (Medical): No    Lack of Transportation (Non-Medical): No  Physical Activity: Insufficiently Active (04/08/2022)   Exercise Vital Sign    Days of Exercise per Week: 3 days    Minutes of Exercise per Session: 30 min  Stress: No Stress Concern Present (04/08/2022)   [place]    Feeling of Stress : Only a little  Social Connections: Socially Integrated (04/08/2022)   Social Connection and Isolation Panel [NHANES]    Frequency of Communication with Friends and Family: More than three times a week    Frequency of Social Gatherings with Friends and Family: Twice a week    Attends Religious Services: More than 4 times per year    Active Member of [employer] or Organizations: Yes    Attends Music therapist: More than 4 times per year    Marital Status: Married  Human resources officer Violence: Not on file   Current Outpatient Medications on File Prior to Visit  Medication Sig Dispense Refill   albuterol (VENTOLIN HFA) 108 (90 Base) MCG/ACT inhaler Inhale 2 puffs into the lungs every 6 (six) hours as needed for wheezing or shortness of breath. 8 g 5   Cholecalciferol (D3-50) 1.25 MG (50000 UT) capsule Take 50,000 Units by mouth daily. (Patient not taking: Reported on 08/19/2022)     cyanocobalamin 1000 MCG tablet Take 1,000 mcg by mouth daily. (Patient not taking: Reported on 08/19/2022)     cyclobenzaprine (FLEXERIL) 5 MG tablet Take up to 3 times a day as needed for symptoms related to sciatica. 30 tablet 1   fluticasone (FLONASE) 50 MCG/ACT nasal spray Place 1 spray into  both nostrils daily. 16 g 6   Multiple Vitamins-Minerals (VITAMIN D3 COMPLETE PO) Take 1 capsule by mouth every 7 (seven) days. (Patient not taking: Reported on 08/19/2022)     No current facility-administered medications on file prior to visit.   Allergies  Allergen Reactions   Latex Itching   Family History  Problem Relation Age of Onset   Cancer Mother    Diabetes Mother    Depression Father    Diabetes Father    Arthritis Sister    Depression Sister    Arthritis Maternal Grandmother    Diabetes Maternal Grandmother    Esophageal cancer Maternal Grandfather    COPD Maternal Grandfather    Early death Maternal Grandfather    Diabetes Paternal Grandmother    Depression Paternal Grandmother    Arthritis Paternal Grandmother  Thyroid disease Neg Hx    Colon cancer Neg Hx    Stomach cancer Neg Hx    PE: BP 128/80 (BP Location: Right Arm, Patient Position: Sitting, Cuff Size: Normal)   Pulse 75   Ht '5\' 3"'$  (1.6 m)   Wt 227 lb 3.2 oz (103.1 kg)   LMP 08/04/2022 (Exact Date)   SpO2 99%   BMI 40.25 kg/m  Wt Readings from Last 3 Encounters:  09/03/22 227 lb 3.2 oz (103.1 kg)  08/19/22 227 lb 9.6 oz (103.2 kg)  06/19/22 217 lb 3.2 oz (98.5 kg)   Constitutional: overweight, in NAD Eyes:  EOMI, no exophthalmos ENT: Right thyromegaly, no cervical lymphadenopathy Cardiovascular: RRR, No MRG Respiratory: CTA B Musculoskeletal: no deformities Skin:no rashes Neurological: no tremor with outstretched hands  ASSESSMENT: 1.  Multinodular goiter  2.  History of thyrotoxicosis  PLAN: 1. Thyroid nodules - I reviewed the image of her R thyroid ultrasound. The dominant nodule are large, however, it does not have other concerning features to increase her risk of cancer: - hypoechogenicity - presence of microcalcifications - presence of internal blood flow - taller-than-wide distribution - irregular contours - The nodule appears to be spongiform, which confers a very low risk  of cancer, and is essentially considered a benign lesion.  Moreover, this nodule was biopsied in the past with benign results per review of the chart. - the rest of the previously seen nodules appear to be pseudonodules or inflammatory lesions, considered benign - Pt does not have a thyroid cancer family history or a personal history of RxTx to head/neck. All these would favor benignity.  - she does not have neck compression symptoms  - also, the above lesions were followed for at least 13 years, without significant evolution - Therefore, I would not suggest further follow-up unless she starts developing neck compression symptoms or the nodule is visible or palpated in her neck - We did discuss that  thyroid cancer is a slow-growing cancer with good prognosis, and usually life expectancy or quality of life is unlikely to be affected by this.  However, it does require thyroidectomy and possibly the need of levothyroxine replacement afterwards - However, for now, there is no suspicion for cancer so we can continue to follow her expectantly. She appears to be relieved and agrees with the plan.  2.  History of thyrotoxicosis - dx in 2021 - she is not on any medication that can influence thyroid tests - reviewed latest TFTs and these were normal close in 12/2021 in 08/2022. - will continue to follow her expectantly - no intervention needed for now  - Total time spent for the visit: 40 min, in precharting, reviewing Dr. Cordelia Pen last note, obtaining medical information from the chart and from the pt, reviewing her  previous labs, images, evaluations, and treatments, reviewing her symptoms, counseling her about her thyroid conditions(please see the discussed topics above), and developing a plan to further treat them; she had a number of questions which I addressed.   Philemon Kingdom, MD PhD [facility]

## 2022-09-10 ENCOUNTER — Encounter: Payer: Self-pay | Admitting: Family Medicine

## 2022-09-23 ENCOUNTER — Ambulatory Visit: Payer: 59 | Admitting: Family Medicine

## 2022-09-26 ENCOUNTER — Ambulatory Visit (INDEPENDENT_AMBULATORY_CARE_PROVIDER_SITE_OTHER): Payer: 59

## 2022-09-26 DIAGNOSIS — E538 Deficiency of other specified B group vitamins: Secondary | ICD-10-CM

## 2022-09-26 MED ORDER — CYANOCOBALAMIN 1000 MCG/ML IJ SOLN
1000.0000 ug | Freq: Once | INTRAMUSCULAR | Status: AC
  Administered 2022-09-26: 1000 ug via INTRAMUSCULAR

## 2022-09-26 NOTE — Progress Notes (Signed)
Per orders of Dr. Volanda Napoleon, injection of Cyanocobalamin Inj. 1000 mcg given by Encarnacion Slates on Right deltoid Patient tolerated injection well.   Next inject is scheduled on 10/24/2022.

## 2022-10-14 ENCOUNTER — Ambulatory Visit: Payer: 59 | Admitting: Gastroenterology

## 2022-10-14 ENCOUNTER — Encounter: Payer: Self-pay | Admitting: Gastroenterology

## 2022-10-14 VITALS — BP 100/70 | HR 68 | Ht 63.25 in | Wt 212.0 lb

## 2022-10-14 DIAGNOSIS — K580 Irritable bowel syndrome with diarrhea: Secondary | ICD-10-CM

## 2022-10-14 DIAGNOSIS — R195 Other fecal abnormalities: Secondary | ICD-10-CM

## 2022-10-14 NOTE — Patient Instructions (Signed)
Take Benefiber 1 tablespoon twice daily  Follow up in 6 months, please contact the office back to schedule that appointment at a later date  _______________________________________________________  If your blood pressure at your visit was 140/90 or greater, please contact your primary care physician to follow up on this.  _______________________________________________________  If you are age 50 or older, your body mass index should be between 23-30. Your Body mass index is 37.26 kg/m. If this is out of the aforementioned range listed, please consider follow up with your Primary Care Provider.  If you are age 6 or younger, your body mass index should be between 19-25. Your Body mass index is 37.26 kg/m. If this is out of the aformentioned range listed, please consider follow up with your Primary Care Provider.   ________________________________________________________  The [place] [facility] providers would like to encourage you to use [facility] to communicate with providers for non-urgent requests or questions.  Due to long hold times on the telephone, sending your provider a message by [facility] may be a faster and more efficient way to get a response.  Please allow 48 business hours for a response.  Please remember that this is for non-urgent requests.  _______________________________________________________   Thank you for choosing [facility]  Kavitha Nandigam,MD

## 2022-10-14 NOTE — Progress Notes (Signed)
Renee Day    [id number]    1970/11/15  Primary Care Physician:Banks, Langley Adie, MD  Referring Physician: Billie Ruddy, MD [place],  [place] [id number]   Chief complaint:  Diarrhea  HPI: 52 year old very pleasant female here for follow-up visit for heme positive stool She is experiencing less formed stool with soft to semiformed bowel movements, no significant increase in bowel frequency.  She has altered her diet significantly and is trying to get more fiber and greens. Denies any rectal bleeding or melena.  She had mild elevation in CRP Vitamin D deficiency  Relevant medical history includes multinodular goiter and subclinical hypothyroidism Family history positive for Crohn's disease in maternal aunt.  Her sister has lupus.  Capsule endoscopy 04/04/22 Incomplete study otherwise unremarkable  Colonoscopy 04/04/22 Impression:               - Two 4 to 6 mm polyps in the ascending colon,                            removed with a cold snare. Resected and retrieved.                           - Non-bleeding external and internal hemorrhoids.  EGD 04/04/22: Normal    Outpatient Encounter Medications as of 10/14/2022  Medication Sig   albuterol (VENTOLIN HFA) 108 (90 Base) MCG/ACT inhaler Inhale 2 puffs into the lungs every 6 (six) hours as needed for wheezing or shortness of breath.   Cholecalciferol (D3-50) 1.25 MG (50000 UT) capsule Take 50,000 Units by mouth daily.   Cyanocobalamin (VITAMIN B-12 IJ) Inject 1,000 mcg as directed every 30 (thirty) days.   cyclobenzaprine (FLEXERIL) 5 MG tablet Take up to 3 times a day as needed for symptoms related to sciatica.   fluticasone (FLONASE) 50 MCG/ACT nasal spray Place 1 spray into both nostrils daily.   Multiple Vitamins-Minerals (VITAMIN D3 COMPLETE PO) Take 1 capsule by mouth every 7 (seven) days.   cyanocobalamin 1000 MCG tablet Take 1,000 mcg by mouth daily. (Patient not taking: Reported  on 10/14/2022)   No facility-administered encounter medications on file as of 10/14/2022.    Allergies as of 10/14/2022 - Review Complete 10/14/2022  Allergen Reaction Noted   Latex Itching 04/04/2022    Past Medical History:  Diagnosis Date   Asthma    Blood in stool    Migraine    Thyroid disease    UTI (urinary tract infection)    Vitamin B12 deficiency     Past Surgical History:  Procedure Laterality Date   COLONOSCOPY      Family History  Problem Relation Age of Onset   Cancer Mother    Diabetes Mother    Depression Father    Diabetes Father    Arthritis Sister    Depression Sister    Arthritis Maternal Grandmother    Diabetes Maternal Grandmother    Esophageal cancer Maternal Grandfather    COPD Maternal Grandfather    Early death Maternal Grandfather    Diabetes Paternal Grandmother    Depression Paternal Grandmother    Arthritis Paternal Grandmother    Thyroid disease Neg Hx    Colon cancer Neg Hx    Stomach cancer Neg Hx     Social History   Socioeconomic History   Marital status: Married  Spouse name: Not on file   Number of children: 0   Years of education: Not on file   Highest education level: Master's degree (e.g., MA, MS, MEng, MEd, MSW, MBA)  Occupational History   Not on file  Tobacco Use   Smoking status: Never   Smokeless tobacco: Never  Vaping Use   Vaping Use: Never used  Substance and Sexual Activity   Alcohol use: Not Currently   Drug use: Never   Sexual activity: Yes  Other Topics Concern   Not on file  Social History Narrative   Not on file   Social Determinants of Health   Financial Resource Strain: Low Risk  (04/08/2022)   Overall Financial Resource Strain (CARDIA)    Difficulty of Paying Living Expenses: Not hard at all  Food Insecurity: No Food Insecurity (04/08/2022)   Hunger Vital Sign    Worried About Running Out of Food in the Last Year: Never true    [place] in the Last Year: Never true   Transportation Needs: No Transportation Needs (04/08/2022)   PRAPARE - Hydrologist (Medical): No    Lack of Transportation (Non-Medical): No  Physical Activity: Insufficiently Active (04/08/2022)   Exercise Vital Sign    Days of Exercise per Week: 3 days    Minutes of Exercise per Session: 30 min  Stress: No Stress Concern Present (04/08/2022)   [place]    Feeling of Stress : Only a little  Social Connections: Socially Integrated (04/08/2022)   Social Connection and Isolation Panel [NHANES]    Frequency of Communication with Friends and Family: More than three times a week    Frequency of Social Gatherings with Friends and Family: Twice a week    Attends Religious Services: More than 4 times per year    Active Member of [employer] or Organizations: Yes    Attends Music therapist: More than 4 times per year    Marital Status: Married  Human resources officer Violence: Not on file      Review of systems: All other review of systems negative except as mentioned in the HPI.   Physical Exam: Vitals:   10/14/22 1003  BP: 100/70  Pulse: 68   Body mass index is 37.26 kg/m. Gen:      No acute distress HEENT:  sclera anicteric Abd:      soft, non-tender; no palpable masses, no distension Ext:    No edema Neuro: alert and oriented x 3 Psych: normal mood and affect  Data Reviewed:  Reviewed labs, radiology imaging, old records and pertinent past GI work up   Assessment and Plan/Recommendations:  52 year old very pleasant female with history of multinodular goiter, family history of Crohn's disease and second-degree, B12 deficiency here for follow-up visit for acute positive stool  She is s/p EGD and colonoscopy Small bowel video capsule was incomplete, did not traverse through IC valve, but given overall her symptoms are stable, and no evidence of ongoing anemia, will hold off repeat  small bowel video capsule study Continue to monitor CBC every 6 months  Continue B12 and vitamin D supplements for B12 and vitamin D deficiency  IBS-Diarrhea/Change in bowel habits with dietary changes.  Continue with high-fiber diet and increase water intake And add Benefiber 1 tablespoon 2-3 times daily with meals  Return in 6 months  This visit required 30 minutes of patient care (this includes precharting, chart review, review  of results, face-to-face time used for counseling as well as treatment plan and follow-up. The patient was provided an opportunity to ask questions and all were answered. The patient agreed with the plan and demonstrated an understanding of the instructions.  Damaris Hippo , MD    CC: Billie Ruddy, MD

## 2022-10-22 IMAGING — CR DG ANKLE COMPLETE 3+V*L*
3 series · 3 of 3 positions shown · non-contrast
Comparison: None.

CLINICAL DATA: Ankle pain and swelling

EXAM:
LEFT ANKLE COMPLETE - 3+ VIEW

[t ankle joint ap left]
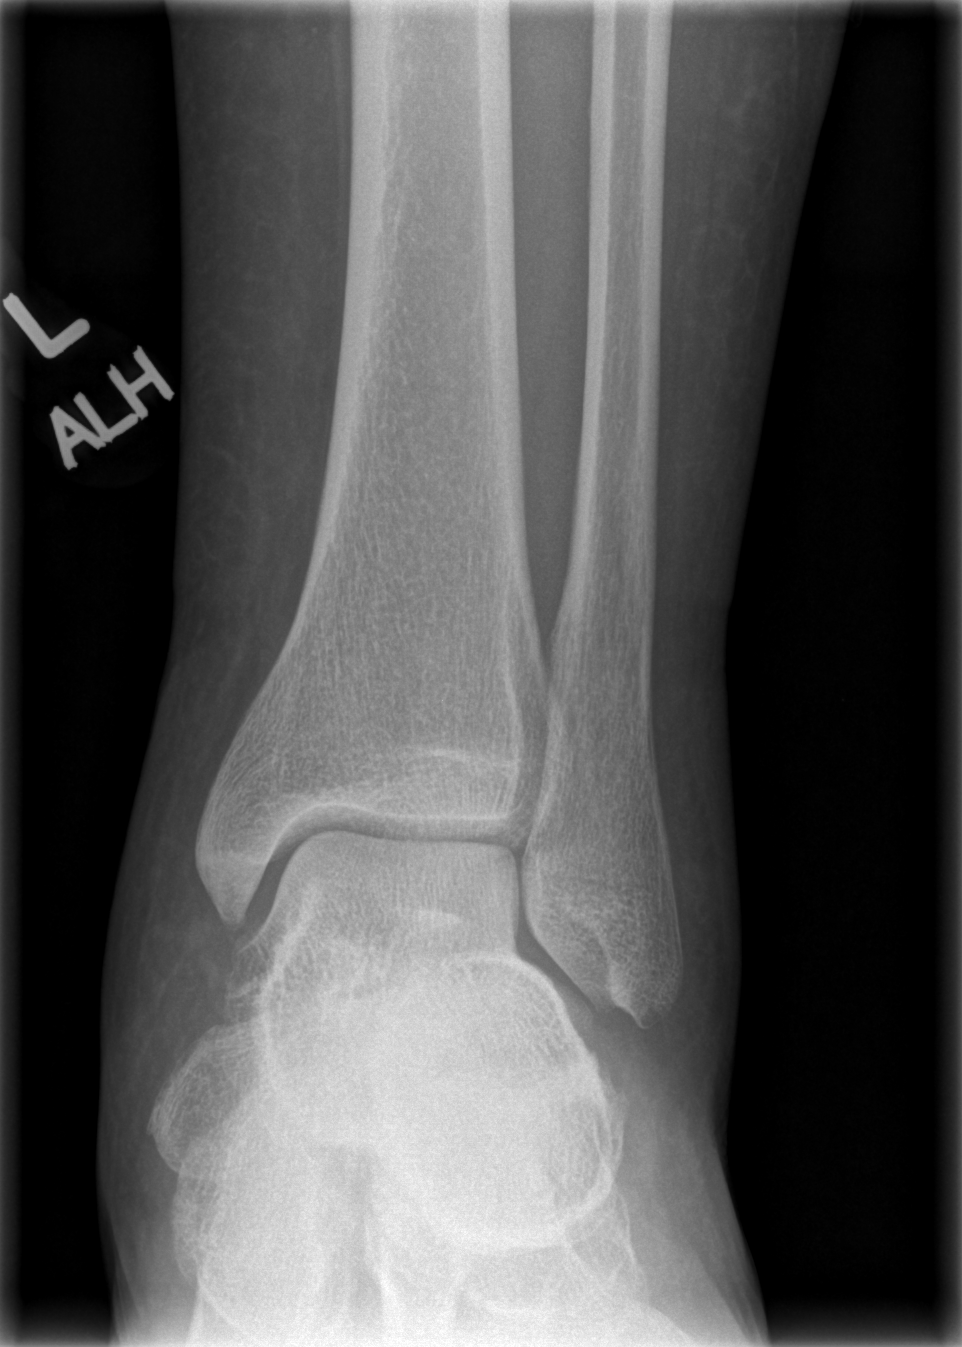

[t ankle joint oblique left]
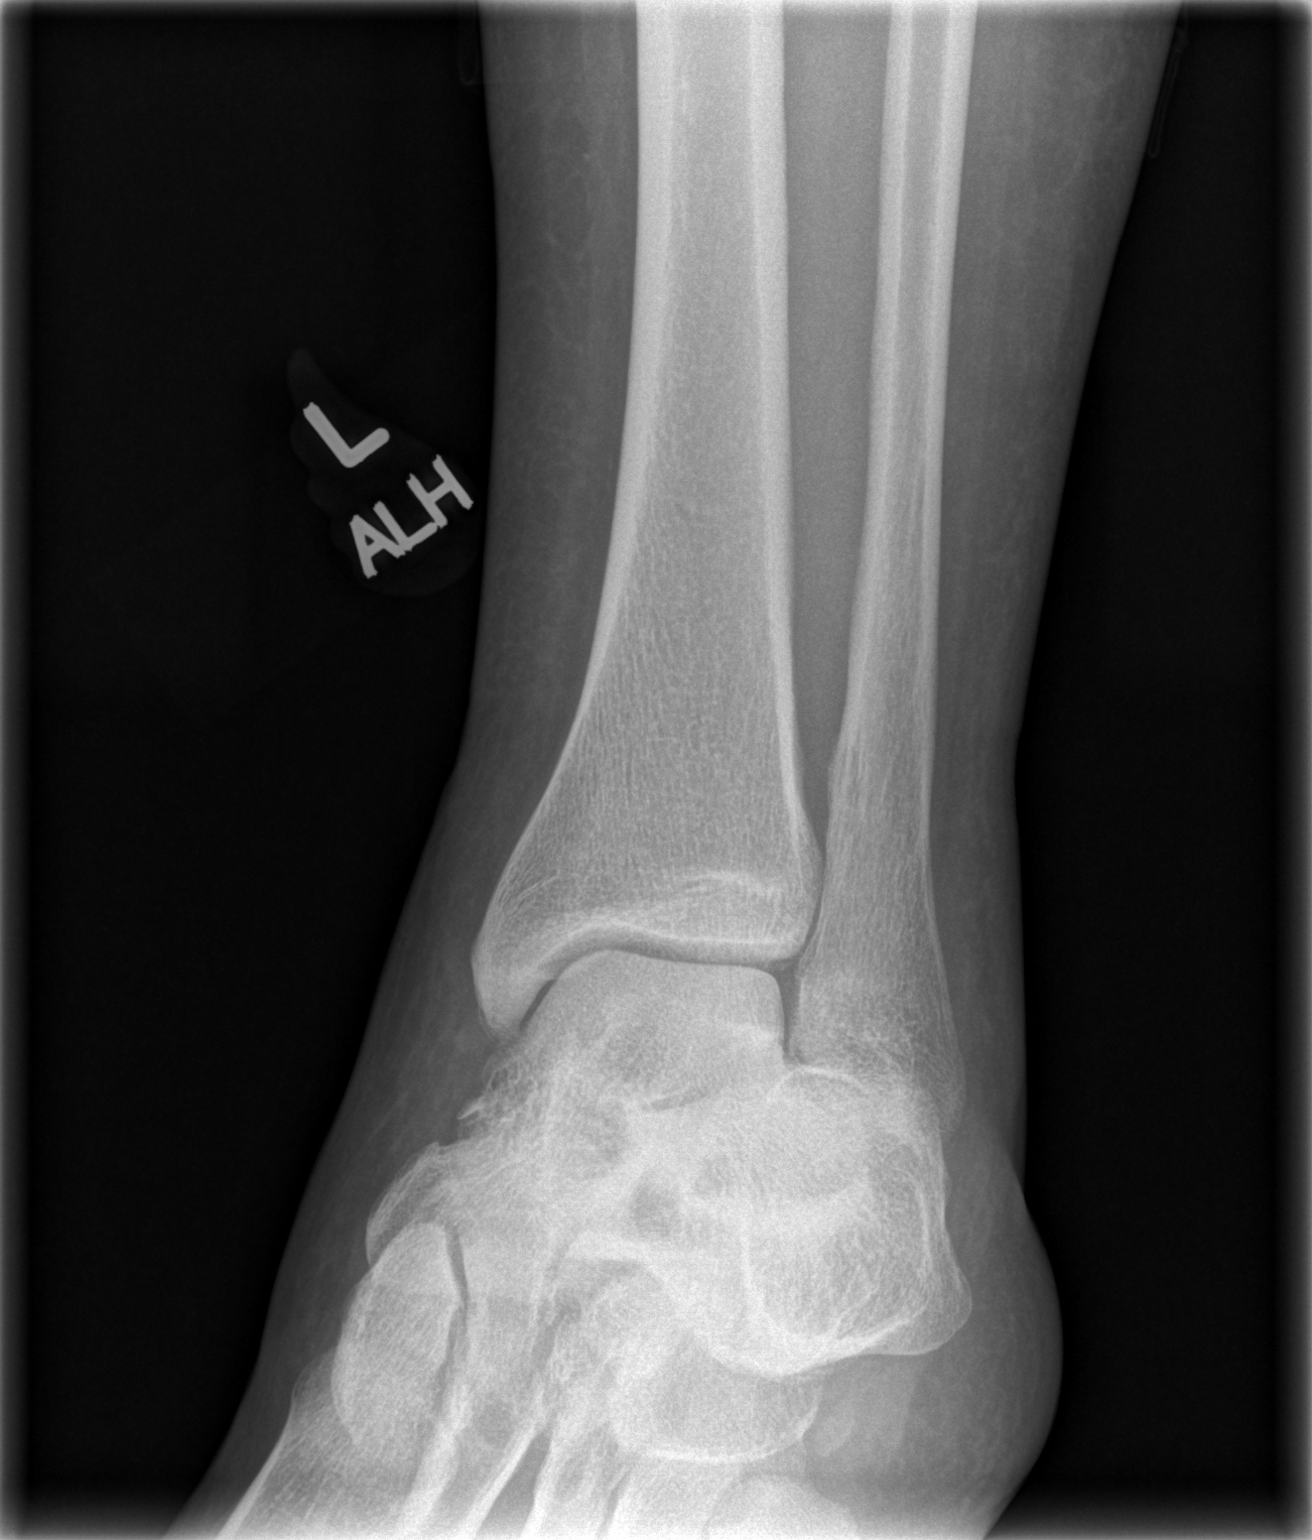

[t ankle joint lat left]
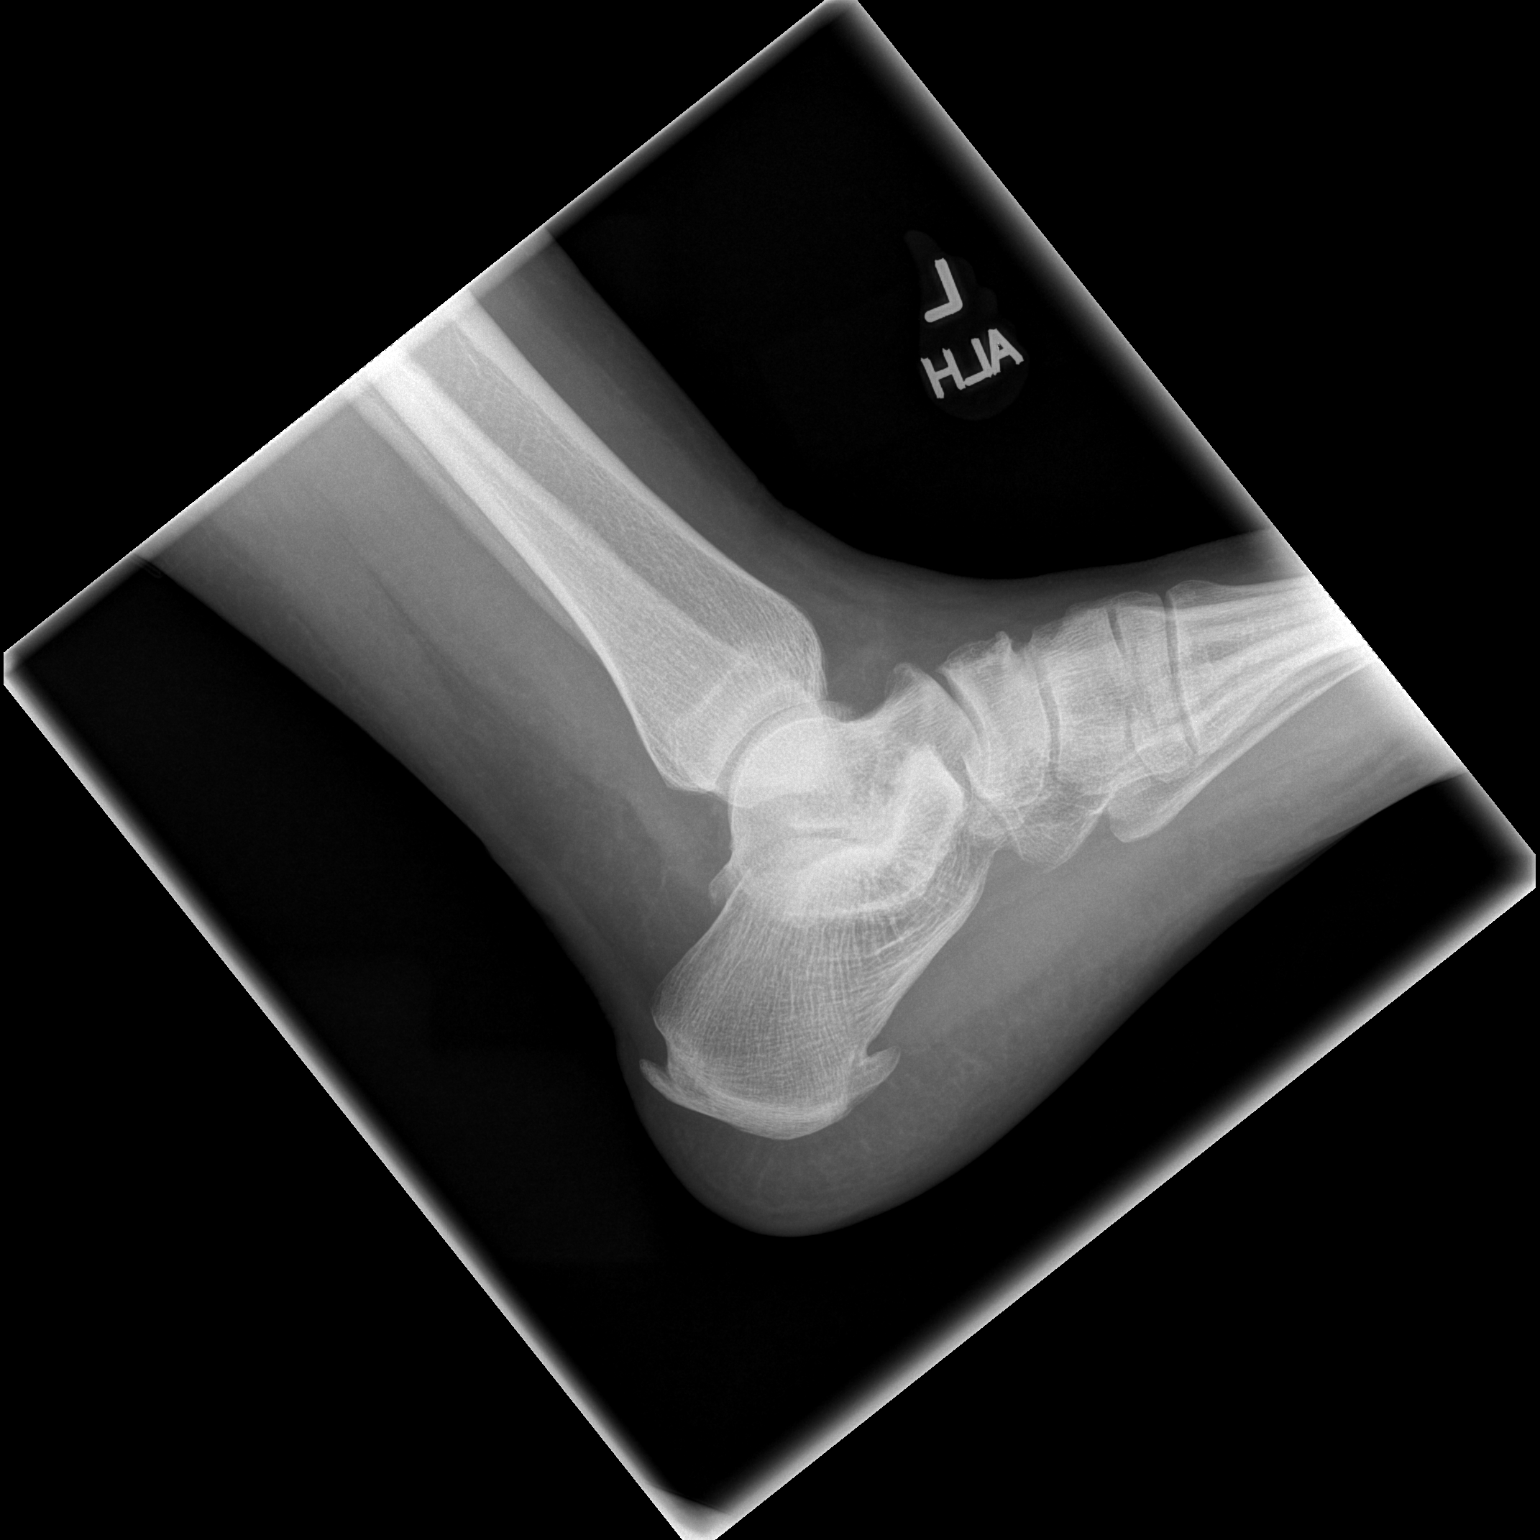

[3 of 3 positions shown; findings below may reference images not displayed]

FINDINGS: No fracture or malalignment. Moderate plantar calcaneal spur.
Mortise symmetric. Soft tissue swelling is present.
IMPRESSION: Soft tissue swelling.  No acute osseous abnormality

## 2022-10-24 ENCOUNTER — Ambulatory Visit (INDEPENDENT_AMBULATORY_CARE_PROVIDER_SITE_OTHER): Payer: 59

## 2022-10-24 DIAGNOSIS — E538 Deficiency of other specified B group vitamins: Secondary | ICD-10-CM | POA: Diagnosis not present

## 2022-10-24 MED ORDER — CYANOCOBALAMIN 1000 MCG/ML IJ SOLN
1000.0000 ug | Freq: Once | INTRAMUSCULAR | Status: AC
  Administered 2022-10-24: 1000 ug via INTRAMUSCULAR

## 2022-10-24 NOTE — Progress Notes (Signed)
Pt here for monthly B12 injection per Dr Volanda Napoleon  B12 1097mg given IM Right Deltoid, and pt tolerated injection well.  Today was pt last injection, aware to start taking Vit B12 supplements per PCP.

## 2022-11-05 LAB — HM MAMMOGRAPHY

## 2022-11-06 ENCOUNTER — Encounter: Payer: Self-pay | Admitting: Neurology

## 2022-11-06 ENCOUNTER — Ambulatory Visit: Payer: 59 | Admitting: Neurology

## 2022-11-06 VITALS — BP 107/72 | HR 71 | Ht 63.0 in | Wt 211.4 lb

## 2022-11-06 DIAGNOSIS — R200 Anesthesia of skin: Secondary | ICD-10-CM | POA: Diagnosis not present

## 2022-11-06 DIAGNOSIS — M2550 Pain in unspecified joint: Secondary | ICD-10-CM | POA: Diagnosis not present

## 2022-11-06 DIAGNOSIS — Z840 Family history of diseases of the skin and subcutaneous tissue: Secondary | ICD-10-CM

## 2022-11-06 DIAGNOSIS — R202 Paresthesia of skin: Secondary | ICD-10-CM

## 2022-11-06 DIAGNOSIS — E531 Pyridoxine deficiency: Secondary | ICD-10-CM

## 2022-11-06 DIAGNOSIS — Z8261 Family history of arthritis: Secondary | ICD-10-CM

## 2022-11-06 DIAGNOSIS — Z832 Family history of diseases of the blood and blood-forming organs and certain disorders involving the immune mechanism: Secondary | ICD-10-CM

## 2022-11-06 DIAGNOSIS — E519 Thiamine deficiency, unspecified: Secondary | ICD-10-CM

## 2022-11-06 NOTE — Patient Instructions (Addendum)
Blood work EMG/NCS Consider MRI brain and cervical spine after above   Peripheral Neuropathy Peripheral neuropathy is a type of nerve damage. It affects nerves that carry signals between the spinal cord and the arms, legs, and the rest of the body (peripheral nerves). It does not affect nerves in the spinal cord or brain. In peripheral neuropathy, one nerve or a group of nerves may be damaged. Peripheral neuropathy is a broad category that includes many specific nerve disorders, like diabetic neuropathy, hereditary neuropathy, and carpal tunnel syndrome. What are the causes? This condition may be caused by: Certain diseases, such as: Diabetes. This is the most common cause of peripheral neuropathy. Autoimmune diseases, such as rheumatoid arthritis and systemic lupus erythematosus. Nerve diseases that are passed from parent to child (inherited). Kidney disease. Thyroid disease. Other causes may include: Nerve injury. Pressure or stress on a nerve that lasts a long time. Lack (deficiency) of B vitamins. This can result from alcoholism, poor diet, or a restricted diet. Infections. Some medicines, such as cancer medicines (chemotherapy). Poisonous (toxic) substances, such as lead and mercury. Too little blood flowing to the legs. In some cases, the cause of this condition is not known. What are the signs or symptoms? Symptoms of this condition depend on which of your nerves is damaged. Symptoms in the legs, hands, and arms can include: Loss of feeling (numbness) in the feet, hands, or both. Tingling in the feet, hands, or both. Burning pain. Very sensitive skin. Weakness. Not being able to move a part of the body (paralysis). Clumsiness or poor coordination. Muscle twitching. Loss of balance. Symptoms in other parts of the body can include: Not being able to control your bladder. Feeling dizzy. Sexual problems. How is this diagnosed? Diagnosing and finding the cause of peripheral  neuropathy can be difficult. Your health care provider will take your medical history and do a physical exam. A neurological exam will also be done. This involves checking things that are affected by your brain, spinal cord, and nerves (nervous system). For example, your health care provider will check your reflexes, how you move, and what you can feel. You may have other tests, such as: Blood tests. Electromyogram (EMG) and nerve conduction tests. These tests check nerve function and how well the nerves are controlling the muscles. Imaging tests, such as a CT scan or MRI, to rule out other causes of your symptoms. Removing a small piece of nerve to be examined in a lab (nerve biopsy). Removing and examining a small amount of the fluid that surrounds the brain and spinal cord (lumbar puncture). How is this treated? Treatment for this condition may involve: Treating the underlying cause of the neuropathy, such as diabetes, kidney disease, or vitamin deficiencies. Stopping medicines that can cause neuropathy, such as chemotherapy. Medicine to help relieve pain. Medicines may include: Prescription or over-the-counter pain medicine. Anti-seizure medicine. Antidepressants. Pain-relieving patches that are applied to painful areas of skin. Surgery to relieve pressure on a nerve or to destroy a nerve that is causing pain. Physical therapy to help improve movement and balance. Devices to help you move around (assistive devices). Follow these instructions at home: Medicines Take over-the-counter and prescription medicines only as told by your health care provider. Do not take any other medicines without first asking your health care provider. Ask your health care provider if the medicine prescribed to you requires you to avoid driving or using machinery. Lifestyle  Do not use any products that contain nicotine or tobacco. These  products include cigarettes, chewing tobacco, and vaping devices, such as  e-cigarettes. Smoking keeps blood from reaching damaged nerves. If you need help quitting, ask your health care provider. Avoid or limit alcohol. Too much alcohol can cause a vitamin B deficiency, and vitamin B is needed for healthy nerves. Eat a healthy diet. This includes: Eating foods that are high in fiber, such as beans, whole grains, and fresh fruits and vegetables. Limiting foods that are high in fat and processed sugars, such as fried or sweet foods. General instructions  If you have diabetes, work closely with your health care provider to keep your blood sugar under control. If you have numbness in your feet: Check every day for signs of injury or infection. Watch for redness, warmth, and swelling. Wear padded socks and comfortable shoes. These help protect your feet. Develop a good support system. Living with peripheral neuropathy can be stressful. Consider talking with a mental health specialist or joining a support group. Use assistive devices and attend physical therapy as told by your health care provider. This may include using a walker or a cane. Keep all follow-up visits. This is important. Where to find more information [employer] of Neurological Disorders: [employer] Contact a health care provider if: You have new signs or symptoms of peripheral neuropathy. You are struggling emotionally from dealing with peripheral neuropathy. Your pain is not well controlled. Get help right away if: You have an injury or infection that is not healing normally. You develop new weakness in an arm or leg. You have fallen or do so frequently. Summary Peripheral neuropathy is when the nerves in the arms or legs are damaged, resulting in numbness, weakness, or pain. There are many causes of peripheral neuropathy, including diabetes, pinched nerves, vitamin deficiencies, autoimmune disease, and hereditary conditions. Diagnosing and finding the cause of peripheral neuropathy can  be difficult. Your health care provider will take your medical history, do a physical exam, and do tests, including blood tests and nerve function tests. Treatment involves treating the underlying cause of the neuropathy and taking medicines to help control pain. Physical therapy and assistive devices may also help. This information is not intended to replace advice given to you by your health care provider. Make sure you discuss any questions you have with your health care provider. Document Revised: 04/24/2021 Document Reviewed: 04/24/2021 Elsevier Patient Education  [employer].

## 2022-11-06 NOTE — Progress Notes (Signed)
[facility]    Provider:  Dr Jaynee Eagles Requesting Provider: Billie Ruddy, MD Primary Care Provider:  Billie Ruddy, MD  CC: Numbness tingling burning in both legs and both hands  HPI:  Renee Day is a 52 y.o. female here as requested by Billie Ruddy, MD for bilateral upper and lower extremity paresthesias possibly nerve conduction study EMG.  Patient has a history of B12 deficiency, subclinical hyperthyroidism, vitamin D deficiency.  She noticed the paresthesias a year ago but worsened, in her hands digits 4-5, She noticed the paresthesias a year ago but worsened, in her hands digits 4-5, unusual for polyneuropathy may be ulnar: emg/ncs. She gets some neck pain "from time to time" but no cervical radiculopathy. The numbness and tingling in her hands happen when holding things, she wakes in the middle of the night and shake out the numb hands. Feet are burning, numbness, started in the toes and radiating up the leg. Sister has lupus. She has had some strange neurologic issues, she saw ophthalmology, nothing was found, she woke up and her eye lids were weak. Legs burning starting in the feet and radiating up and in the hands more numbness digits 4-5. Symmetrical but swelling in the left. No headaches, no changes in vision, she has weakness in hands, no other weakness except possibly unilateral hip flexion weakness, she has a history of numbness in the left leg, no back pain or radicular symptoms. No other focal neurologic deficits, associated symptoms, inciting events or modifiable factors.  Reviewed notes, labs and imaging from outside physicians, which showed:  B12 in December was 234 however it was less than 200 about a year ago, she does still be B12 deficient.  We like to see it above 400.  Reviewed notes, labs and imaging from outside physicians, which showed:  Recent Results (from the past 2160 hour(s))  Vitamin B12     Status: None   Collection Time:  08/19/22 12:16 PM  Result Value Ref Range   Vitamin B-12 234 211 - 911 pg/mL  Folate     Status: None   Collection Time: 08/19/22 12:16 PM  Result Value Ref Range   Folate 14.0 >5.9 ng/mL  CBC with Differential/Platelet     Status: None   Collection Time: 08/19/22 12:16 PM  Result Value Ref Range   WBC 9.2 4.0 - 10.5 K/uL   RBC 4.45 3.87 - 5.11 Mil/uL   Hemoglobin 12.4 12.0 - 15.0 g/dL   HCT 38.0 36.0 - 46.0 %   MCV 85.4 78.0 - 100.0 fl   MCHC 32.6 30.0 - 36.0 g/dL   RDW 13.0 11.5 - 15.5 %   Platelets 251.0 150.0 - 400.0 K/uL   Neutrophils Relative % 66.8 43.0 - 77.0 %   Lymphocytes Relative 26.3 12.0 - 46.0 %   Monocytes Relative 5.4 3.0 - 12.0 %   Eosinophils Relative 1.1 0.0 - 5.0 %   Basophils Relative 0.4 0.0 - 3.0 %   Neutro Abs 6.1 1.4 - 7.7 K/uL   Lymphs Abs 2.4 0.7 - 4.0 K/uL   Monocytes Absolute 0.5 0.1 - 1.0 K/uL   Eosinophils Absolute 0.1 0.0 - 0.7 K/uL   Basophils Absolute 0.0 0.0 - 0.1 K/uL  Iron, TIBC and Ferritin Panel     Status: None   Collection Time: 08/19/22 12:16 PM  Result Value Ref Range   Iron 73 40 - 190 mcg/dL   TIBC 311 250 - 450 mcg/dL (calc)   %SAT  23 16 - 45 % (calc)   Ferritin 42 16 - 232 ng/mL  TSH     Status: None   Collection Time: 08/19/22 12:16 PM  Result Value Ref Range   TSH 0.53 0.35 - 5.50 uIU/mL  CMP     Status: None   Collection Time: 08/19/22 12:16 PM  Result Value Ref Range   Sodium 136 135 - 145 mEq/L   Potassium 3.7 3.5 - 5.1 mEq/L   Chloride 102 96 - 112 mEq/L   CO2 26 19 - 32 mEq/L   Glucose, Bld 83 70 - 99 mg/dL   BUN 12 6 - 23 mg/dL   Creatinine, Ser 0.72 0.40 - 1.20 mg/dL   Total Bilirubin 0.5 0.2 - 1.2 mg/dL   Alkaline Phosphatase 71 39 - 117 U/L   AST 14 0 - 37 U/L   ALT 12 0 - 35 U/L   Total Protein 7.5 6.0 - 8.3 g/dL   Albumin 3.8 3.5 - 5.2 g/dL   GFR 98.30 >60.00 mL/min    Comment: Calculated using the CKD-EPI Creatinine Equation (2021)   Calcium 9.1 8.4 - 10.5 mg/dL    CTA H&N 03/2021(prior to these  symptoms): Brain: The brain shows a normal appearance without evidence of malformation, atrophy, old or acute small or large vessel infarction, mass lesion, hemorrhage, hydrocephalus or extra-axial collection.   Vascular: No hyperdense vessel. No evidence of atherosclerotic calcification.   Skull: Normal.  No traumatic finding.  No focal bone lesion.   Sinuses/Orbits: Sinuses are clear. Orbits appear normal. Mastoids are clear.   Other: None significant   CTA HEAD   Anterior circulation: Normal. No evidence of atherosclerotic disease, stenosis, occlusion, aneurysm or vascular malformation. Fetal origin of both posterior cerebral arteries.   Posterior circulation: Normal. No evidence of atherosclerotic disease, stenosis, occlusion, aneurysm or vascular malformation.   Venous sinuses: Patent and normal.   Anatomic variants: None significant.   Review of the MIP images confirms the above findings.   IMPRESSION: Normal head CT. No evidence of hemorrhage or other abnormal finding.   Normal CT angiography. No evidence of aneurysm or other abnormal finding.  Review of Systems: Patient complains of symptoms per HPI as well as the following symptoms weakness. Pertinent negatives and positives per HPI. All others negative.   Social History   Socioeconomic History   Marital status: Married    Spouse name: Not on file   Number of children: 0   Years of education: Not on file   Highest education level: Master's degree (e.g., MA, MS, MEng, MEd, MSW, MBA)  Occupational History   Not on file  Tobacco Use   Smoking status: Never   Smokeless tobacco: Never  Vaping Use   Vaping Use: Never used  Substance and Sexual Activity   Alcohol use: Not Currently   Drug use: Never   Sexual activity: Yes  Other Topics Concern   Not on file  Social History Narrative   Not on file   Social Determinants of Health   Financial Resource Strain: Low Risk  (04/08/2022)   Overall Financial  Resource Strain (CARDIA)    Difficulty of Paying Living Expenses: Not hard at all  Food Insecurity: No Food Insecurity (04/08/2022)   Hunger Vital Sign    Worried About Running Out of Food in the Last Year: Never true    Ran Out of Food in the Last Year: Never true  Transportation Needs: No Transportation Needs (04/08/2022)   PRAPARE - Transportation  Lack of Transportation (Medical): No    Lack of Transportation (Non-Medical): No  Physical Activity: Insufficiently Active (04/08/2022)   Exercise Vital Sign    Days of Exercise per Week: 3 days    Minutes of Exercise per Session: 30 min  Stress: No Stress Concern Present (04/08/2022)   [place]    Feeling of Stress : Only a little  Social Connections: Socially Integrated (04/08/2022)   Social Connection and Isolation Panel [NHANES]    Frequency of Communication with Friends and Family: More than three times a week    Frequency of Social Gatherings with Friends and Family: Twice a week    Attends Religious Services: More than 4 times per year    Active Member of Clubs or Organizations: Yes    Attends Music therapist: More than 4 times per year    Marital Status: Married  Human resources officer Violence: Not on file    Family History  Problem Relation Age of Onset   Cancer Mother    Diabetes Mother    Depression Father    Diabetes Father    Arthritis Sister    Depression Sister    Arthritis Maternal Grandmother    Diabetes Maternal Grandmother    Esophageal cancer Maternal Grandfather    COPD Maternal Grandfather    Early death Maternal Grandfather    Diabetes Paternal Grandmother    Depression Paternal Grandmother    Arthritis Paternal Grandmother    Thyroid disease Neg Hx    Colon cancer Neg Hx    Stomach cancer Neg Hx    Neuropathy Neg Hx     Past Medical History:  Diagnosis Date   Asthma    Blood in stool    Migraine    Thyroid disease    UTI  (urinary tract infection)    Vitamin B12 deficiency     Patient Active Problem List   Diagnosis Date Noted   Vitamin D deficiency 06/15/2021   Vitamin B12 deficiency 06/15/2021   Multinodular goiter 02/23/2021   Acute right ankle pain 02/19/2021   Achilles tendinitis, right leg 02/19/2021   Subclinical hyperthyroidism 10/02/2020    Past Surgical History:  Procedure Laterality Date   COLONOSCOPY      Current Outpatient Medications  Medication Sig Dispense Refill   albuterol (VENTOLIN HFA) 108 (90 Base) MCG/ACT inhaler Inhale 2 puffs into the lungs every 6 (six) hours as needed for wheezing or shortness of breath. 8 g 5   Cholecalciferol (D3-50) 1.25 MG (50000 UT) capsule Take 50,000 Units by mouth daily.     Cyanocobalamin (VITAMIN B-12 IJ) Inject 1,000 mcg as directed every 30 (thirty) days.     cyanocobalamin 1000 MCG tablet Take 1,000 mcg by mouth daily.     cyclobenzaprine (FLEXERIL) 5 MG tablet Take up to 3 times a day as needed for symptoms related to sciatica. 30 tablet 1   fluticasone (FLONASE) 50 MCG/ACT nasal spray Place 1 spray into both nostrils daily. 16 g 6   Multiple Vitamins-Minerals (VITAMIN D3 COMPLETE PO) Take 1 capsule by mouth every 7 (seven) days.     No current facility-administered medications for this visit.    Allergies as of 11/06/2022 - Review Complete 11/06/2022  Allergen Reaction Noted   Latex Itching 04/04/2022    Vitals: BP 107/72   Pulse 71   Ht '5\' 3"'$  (1.6 m)   Wt 211 lb 6.4 oz (95.9 kg)   LMP 10/12/2022  BMI 37.45 kg/m  Last Weight:  Wt Readings from Last 1 Encounters:  11/06/22 211 lb 6.4 oz (95.9 kg)   Last Height:   Ht Readings from Last 1 Encounters:  11/06/22 '5\' 3"'$  (1.6 m)     Physical exam: Exam: Gen: NAD, conversant, well nourised, obese, well groomed                     CV: RRR, no MRG. No Carotid Bruits. No peripheral edema, warm, nontender Eyes: Conjunctivae clear without exudates or hemorrhage  Neuro: Detailed  Neurologic Exam  Speech:    Speech is normal; fluent and spontaneous with normal comprehension.  Cognition:    The patient is oriented to person, place, and time;     recent and remote memory intact;     language fluent;     normal attention, concentration,     fund of knowledge Cranial Nerves:    The pupils are equal, round, and reactive to light. The fundi are normal and spontaneous venous pulsations are present. Visual fields are full to finger confrontation. Extraocular movements are intact. Trigeminal sensation is intact and the muscles of mastication are normal. The face is symmetric. The palate elevates in the midline. Hearing intact. Voice is normal. Shoulder shrug is normal. The tongue has normal motion without fasciculations.   Coordination: nml  Gait:    Heel-toe are normal.   Motor Observation:    No asymmetry, no atrophy, and no involuntary movements noted. Tone:    Normal muscle tone.    Posture:    Posture is normal. normal erect    Strength: weakness ulnar FDP bilat, weakness left leg hip flexion and leg flexion otherwise strength is V/V in the upper and lower limbs.      Sensation: decrease pin prick distally in feet, intact vibration     Reflex Exam:  DTR's:    Uppers brisk, lowers 1+ Toes:    The toes are downgoing bilaterally.   Clonus:    Clonus is absent.   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  We like to see it above 400. Is having injections.  She noticed the paresthesias a year ago but worsened, in her hands digits 4-5, unusual for polyneuropathy may be ulnar: emg/ncs  EMG/NCS: Bilateral uppers and right leg (left leg worse but she has swelling in the left leg may interfere with NCS so do the right leg unless no swelling in the left.   Will check a thorough serum panel for other  causes of polyneuropathy  FHx of autoimmune disease may consider MRI brain/cervical spine for disorders like demyelinating disease or MS or others (+hoffmans)    Orders Placed This Encounter  Procedures   Sjogren's syndrome antibods(ssa + ssb)   Heavy metals, blood   Vitamin B6   Multiple Myeloma Panel (SPEP&IFE w/QIG)   Vitamin B1   ANA Comprehensive Panel   ANA, IFA (with reflex)   Rheumatoid factor   NCV with EMG(electromyography)   No orders of the defined types were placed in this encounter.   Cc: Billie Ruddy, MD,  Billie Ruddy, MD  Sarina Ill, MD  [facility] [address] [place], [place] [address]  Phone [phone] Fax [phone]

## 2022-11-07 ENCOUNTER — Encounter: Payer: Self-pay | Admitting: Gastroenterology

## 2022-11-11 LAB — ANA COMPREHENSIVE PANEL
Anti JO-1: <0.2 AI (ref 0.0–0.9)
Centromere Ab Screen: <0.2 AI (ref 0.0–0.9)
Chromatin Ab SerPl-aCnc: <0.2 AI (ref 0.0–0.9)
ENA RNP Ab: 0.4 AI (ref 0.0–0.9)
ENA SM Ab Ser-aCnc: <0.2 AI (ref 0.0–0.9)
ENA SSA (RO) Ab: <0.2 AI (ref 0.0–0.9)
ENA SSB (LA) Ab: <0.2 AI (ref 0.0–0.9)
Scleroderma (Scl-70) (ENA) Antibody, IgG: <0.2 AI (ref 0.0–0.9)
dsDNA Ab: <1 IU/mL (ref 0–9)

## 2022-11-11 LAB — RHEUMATOID FACTOR: Rheumatoid fact SerPl-aCnc: <10 IU/mL (ref <14.0)

## 2022-11-11 LAB — ANTINUCLEAR ANTIBODIES, IFA: ANA Titer 1: NEGATIVE

## 2022-11-11 LAB — VITAMIN B1: Thiamine: 87.1 nmol/L (ref 66.5–200.0)

## 2022-11-12 IMAGING — CT CT ANGIO HEAD
4 of 14 series · 17 of 47 positions shown · IV contrast (ISOVUE 370)
Comparison: None

CLINICAL DATA: Exertional headache

EXAM:
CT ANGIOGRAPHY HEAD
TECHNIQUE: Multidetector CT imaging of the head was performed using the
standard protocol during bolus administration of intravenous
contrast. Multiplanar CT image reconstructions and MIPs were
obtained to evaluate the vascular anatomy.
CONTRAST:  75mL OMNIPAQUE IOHEXOL 350 MG/ML SOLN

[Series 10: thin axial · axial · 0.41mm/px · z∈[-110,-2]mm · 7 of 144 slices shown]
[im 18/144  brain]
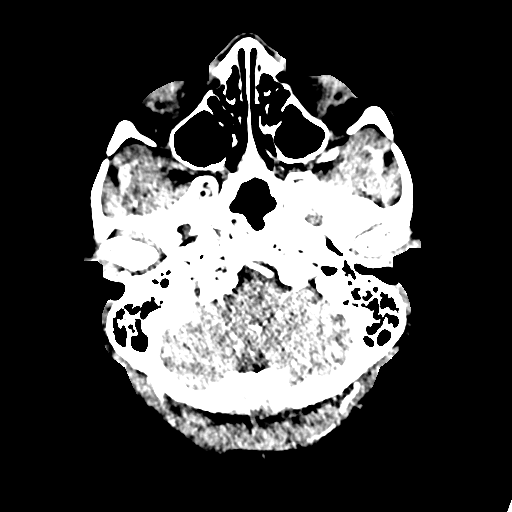
[im 36/144  bone]
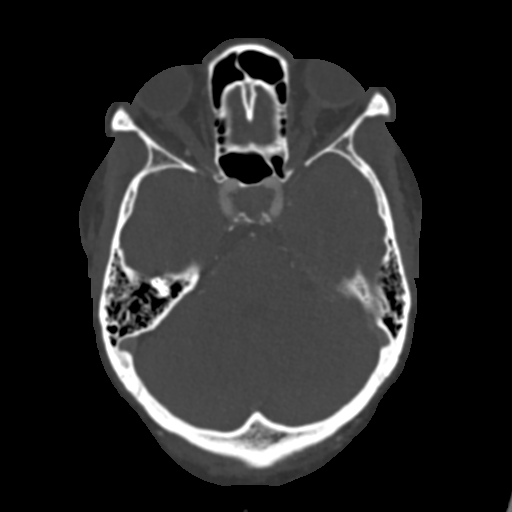
[im 54/144  brain]
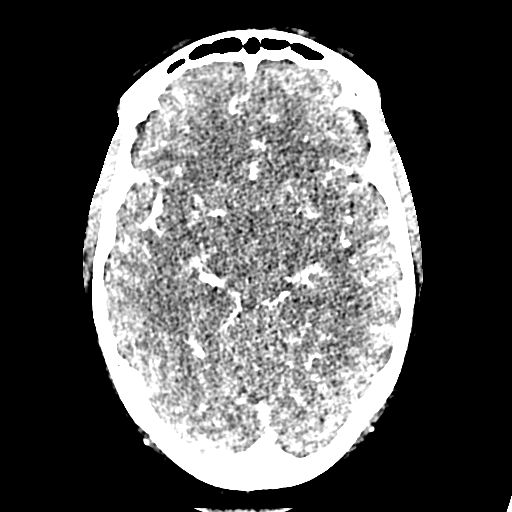
[im 72/144  bone]
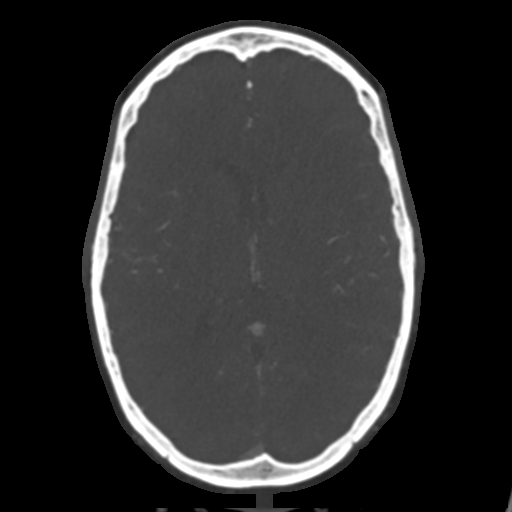
[im 90/144  brain]
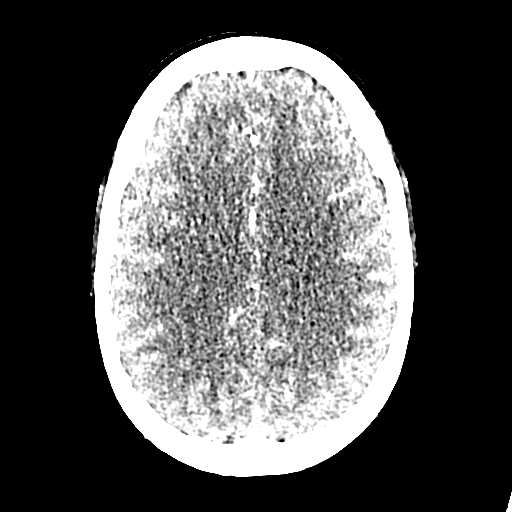
[im 108/144  bone]
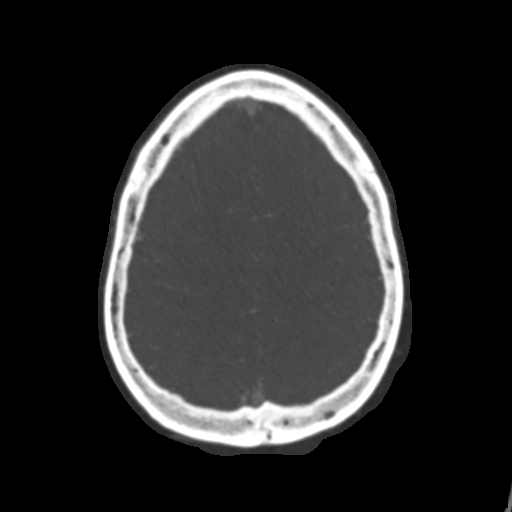
[im 126/144  brain]
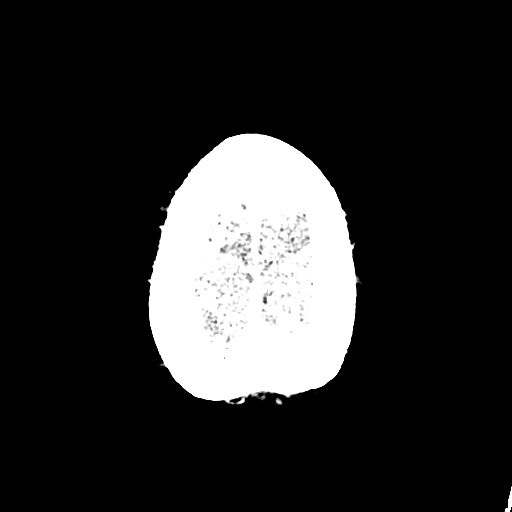

[Series 11: coronal thin · coronal · 0.30mm/px · 3 of 205 slices shown]
[im 69/205  brain]
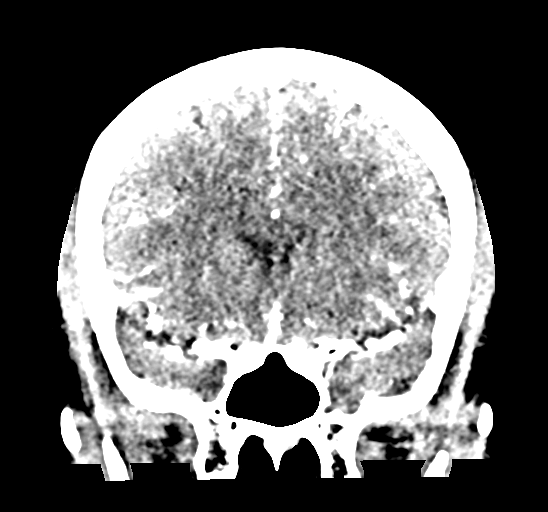
[im 103/205  brain]
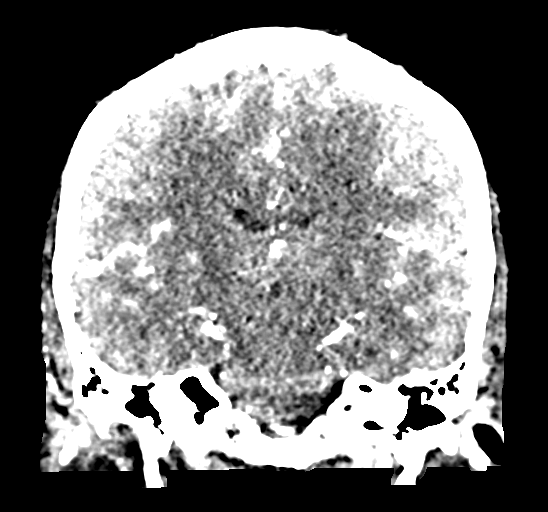
[im 137/205  brain]
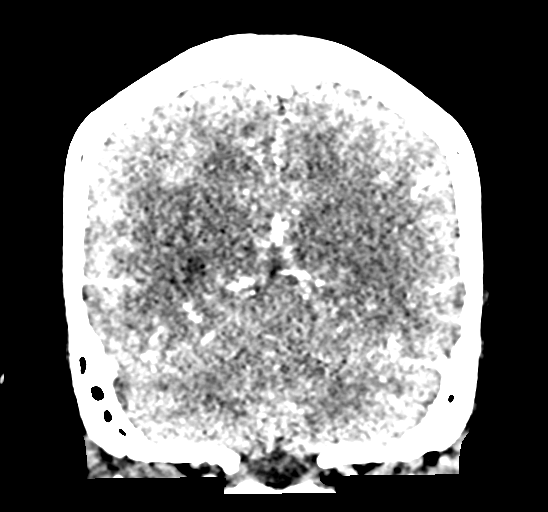

[Series 13: sagittal thin · sagittal · 0.29mm/px · 3 of 167 slices shown]
[im 42/167  brain]
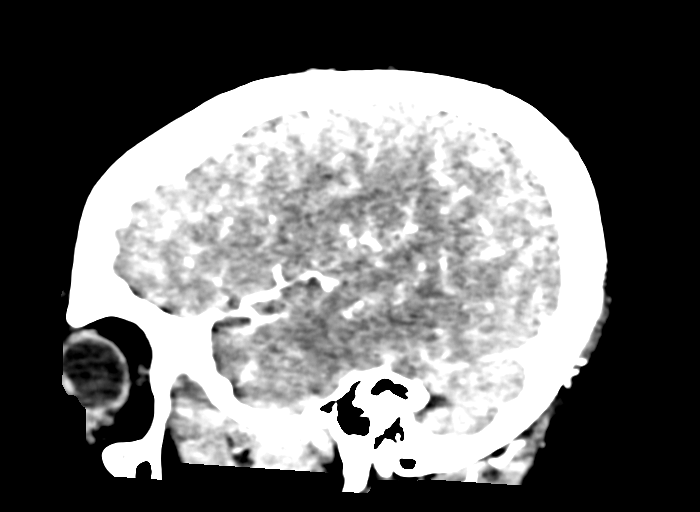
[im 84/167  brain]
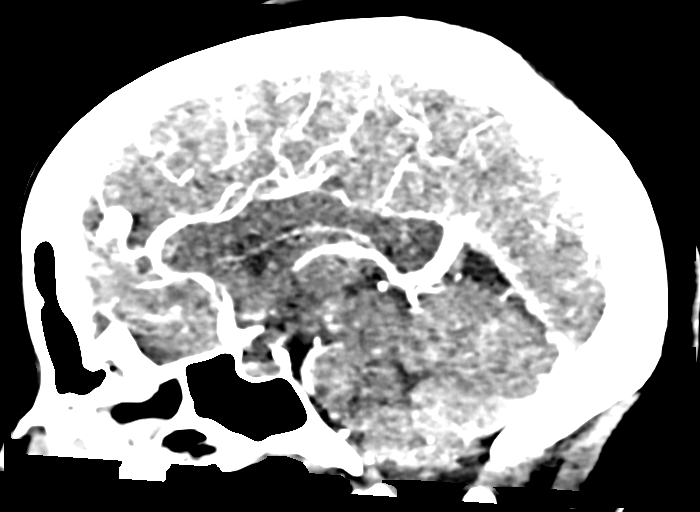
[im 125/167  brain]
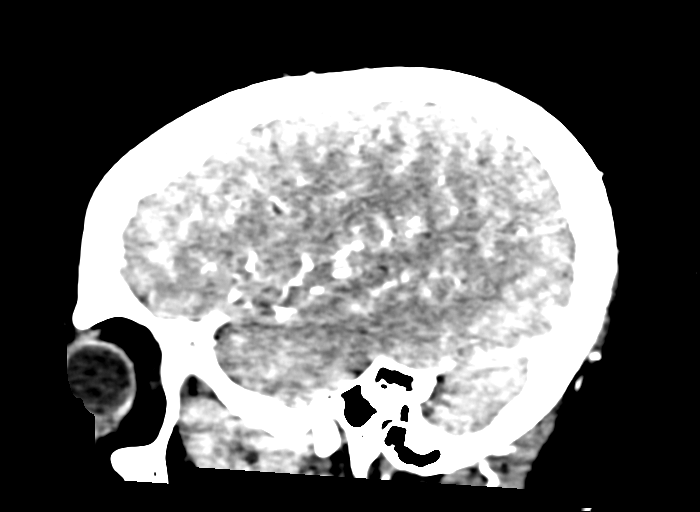

[Series 16: axial mpr · axial · 0.32mm/px · z∈[-117,-68]mm · 4 of 149 slices shown]
[im 17/149  brain]
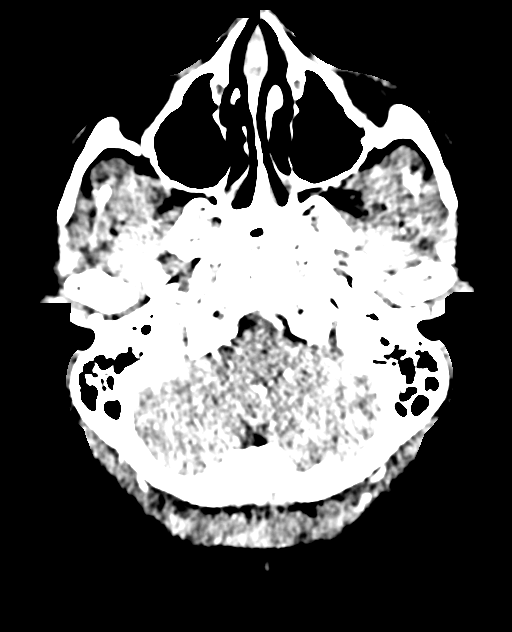
[im 33/149  brain]
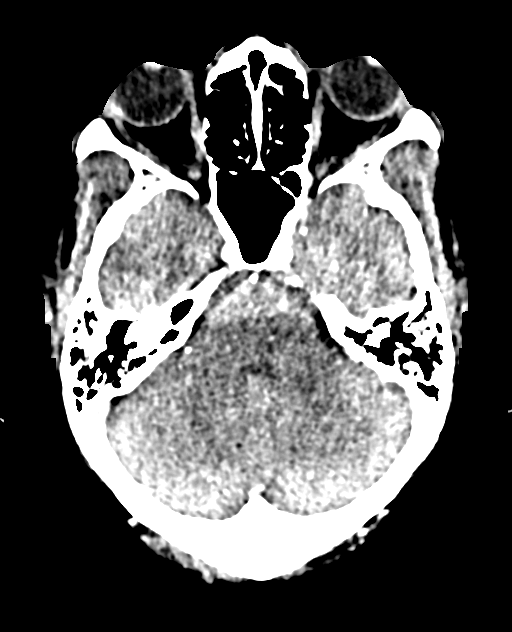
[im 50/149  brain]
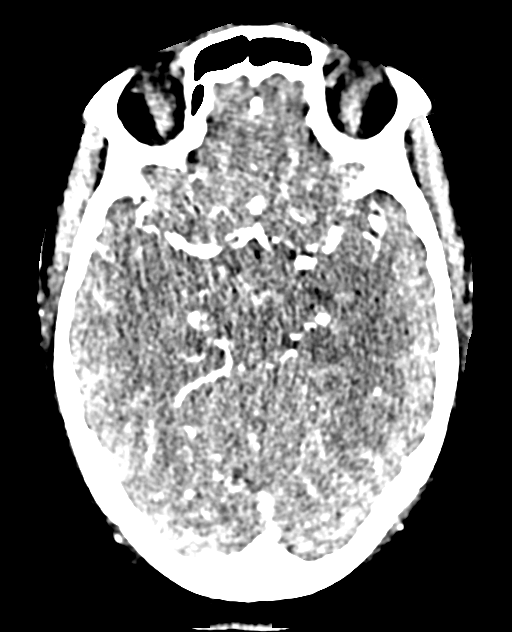
[im 66/149  brain]
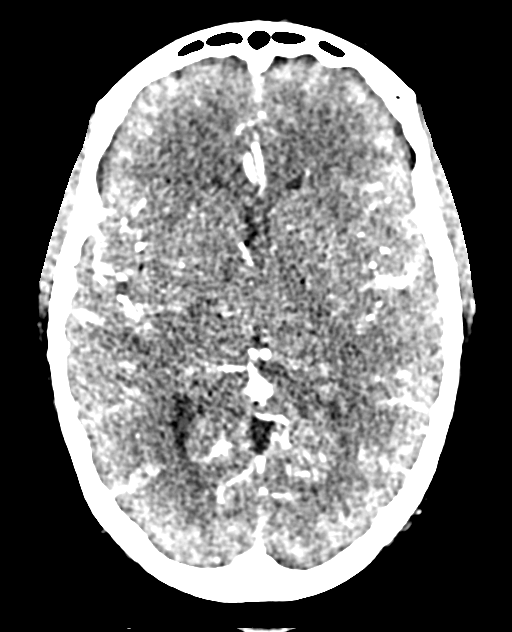

[17 of 47 positions shown; findings below may reference images not displayed]

FINDINGS: CT HEAD

Brain: The brain shows a normal appearance without evidence of
malformation, atrophy, old or acute small or large vessel
infarction, mass lesion, hemorrhage, hydrocephalus or extra-axial
collection.

Vascular: No hyperdense vessel. No evidence of atherosclerotic
calcification.

Skull: Normal.  No traumatic finding.  No focal bone lesion.

Sinuses/Orbits: Sinuses are clear. Orbits appear normal. Mastoids
are clear.

Other: None significant

CTA HEAD

Anterior circulation: Normal. No evidence of atherosclerotic
disease, stenosis, occlusion, aneurysm or vascular malformation.
Fetal origin of both posterior cerebral arteries.

Posterior circulation: Normal. No evidence of atherosclerotic
disease, stenosis, occlusion, aneurysm or vascular malformation.

Venous sinuses: Patent and normal.

Anatomic variants: None significant.

Review of the MIP images confirms the above findings.
IMPRESSION: Normal head CT. No evidence of hemorrhage or other abnormal finding.

Normal CT angiography. No evidence of aneurysm or other abnormal
finding.

## 2022-12-25 ENCOUNTER — Ambulatory Visit (INDEPENDENT_AMBULATORY_CARE_PROVIDER_SITE_OTHER): Payer: 59

## 2022-12-25 ENCOUNTER — Encounter: Payer: Self-pay | Admitting: Family Medicine

## 2022-12-25 ENCOUNTER — Ambulatory Visit: Payer: 59 | Admitting: Family Medicine

## 2022-12-25 VITALS — BP 122/78 | HR 76 | Temp 98.7°F | Wt 213.0 lb

## 2022-12-25 DIAGNOSIS — M79605 Pain in left leg: Secondary | ICD-10-CM

## 2022-12-25 DIAGNOSIS — M25362 Other instability, left knee: Secondary | ICD-10-CM | POA: Diagnosis not present

## 2022-12-25 DIAGNOSIS — R600 Localized edema: Secondary | ICD-10-CM | POA: Diagnosis not present

## 2022-12-25 DIAGNOSIS — M25562 Pain in left knee: Secondary | ICD-10-CM

## 2022-12-25 DIAGNOSIS — M25572 Pain in left ankle and joints of left foot: Secondary | ICD-10-CM

## 2022-12-25 NOTE — Progress Notes (Signed)
Established Patient Office Visit   Subjective  Patient ID: Renee Day, female    DOB: [birth year]  Age: 52 y.o. MRN: [id number]  Chief Complaint  Patient presents with   Knee Pain    And swelling in left knee and ankle, but feels like she has a knot in the hamstring area. Noticed it on Sunday. Did try the flexeril, did not help. It is a chronic pain in ankle. Painful to hurts. Compression socks, tried the ball othing is helping     Patient is a 52 year old female with subclinical hypothyroidism, multinodular border, history of chronic right ankle pain, vitamin D and B12 deficiencies who was seen for acute concern.  Patient endorses left leg pain, edema x 3 days.  Symptoms started on Sunday out of the blue.  Patient denies any change in activity.  It was actually recovering after running a 5K 2 weeks ago.  Feels like a knot is in posterior left thigh.  Patient states left ankle with a shooting pain, left calf sensitive to touch.  Has appointment with neurology 5/16 for EMG for history of paresthesias.  Knee Pain       ROS Negative unless stated above    Objective:     BP 122/78 (BP Location: Right Arm, Patient Position: Sitting, Cuff Size: Normal)   Pulse 76   Temp 98.7 F (37.1 C) (Oral)   Wt 213 lb (96.6 kg)   SpO2 96%   BMI 37.73 kg/m    Physical Exam Constitutional:      General: She is not in acute distress.    Appearance: Normal appearance. She is not ill-appearing.  HENT:     Head: Normocephalic and atraumatic.     Nose: Nose normal.     Mouth/Throat:     Mouth: Mucous membranes are moist.  Eyes:     Extraocular Movements: Extraocular movements intact.     Conjunctiva/sclera: Conjunctivae normal.     Pupils: Pupils are equal, round, and reactive to light.  Cardiovascular:     Rate and Rhythm: Normal rate.     Heart sounds: Normal heart sounds.  Pulmonary:     Effort: Pulmonary effort is normal.     Breath sounds: Normal breath sounds.   Musculoskeletal:     Right knee: Normal.     Left knee: Swelling present. Tenderness present over the medial joint line.     Instability Tests: Medial McMurray test positive.     Right lower leg: Edema present.     Left lower leg: Tenderness present. 1+ Pitting Edema present.     Right ankle: Swelling present.     Left ankle: Swelling present. Tenderness present over the proximal fibula.  Skin:    General: Skin is warm and dry.  Neurological:     Mental Status: She is alert and oriented to person, place, and time. Mental status is at baseline.      No results found for any visits on 12/25/22.    Assessment & Plan:  Acute pain of left knee -     DG Knee Complete 4 Views Left -     Ambulatory referral to Orthopedic Surgery  Left leg pain -     VAS [place] LOWER EXTREMITY VENOUS (DVT); Future -     DG Knee Complete 4 Views Left -     Ambulatory referral to Orthopedic Surgery  Leg edema, left -     VAS [place] LOWER EXTREMITY VENOUS (DVT); Future -  DG Ankle Complete Left -     DG Knee Complete 4 Views Left -     Ambulatory referral to Orthopedic Surgery  Instability of left knee joint -     DG Knee Complete 4 Views Left -     Ambulatory referral to Orthopedic Surgery  Acute left ankle pain -     DG Ankle Complete Left -     Ambulatory referral to Orthopedic Surgery  Concern for DVT, ligament sprain/tear in the knee, ankle fracture versus sprain.  Will obtain imaging to further evaluate.  Continue supportive care including compression, ice, rest, elevation, etc.  Given strict precautions.  Referral to Ortho.    Return if symptoms worsen or fail to improve.   Deeann Saint, MD

## 2022-12-25 NOTE — Patient Instructions (Addendum)
Have included some basic information about DVTs (blood clots), ligament strain in the knee, and ankle pain.  After being get further details from the imaging we can narrow down the cause of your symptoms.  In the meantime continue elevating the leg when resting, compression, ice/heat, etc.  A referral was placed for you to see the orthopedic surgeons.  You should expect a phone call about scheduling this appointment.  If the pain becomes worse many of the orthopedic offices have an urgent care with late hours.  An order was also placed for ultrasound of your leg.  Hopefully they can get this done today.

## 2022-12-26 ENCOUNTER — Ambulatory Visit (HOSPITAL_COMMUNITY)
Admission: RE | Admit: 2022-12-26 | Discharge: 2022-12-26 | Disposition: A | Discharge status: Home / Self Care | Payer: 59 | Source: Ambulatory Visit | Attending: Family Medicine | Admitting: Family Medicine

## 2022-12-26 DIAGNOSIS — R600 Localized edema: Secondary | ICD-10-CM | POA: Diagnosis present

## 2022-12-26 DIAGNOSIS — M79605 Pain in left leg: Secondary | ICD-10-CM | POA: Diagnosis present

## 2023-01-16 ENCOUNTER — Ambulatory Visit: Payer: 59 | Admitting: Neurology

## 2023-01-16 VITALS — BP 120/79 | HR 68 | Ht 63.0 in | Wt 211.0 lb

## 2023-01-16 DIAGNOSIS — G629 Polyneuropathy, unspecified: Secondary | ICD-10-CM | POA: Diagnosis not present

## 2023-01-16 DIAGNOSIS — E531 Pyridoxine deficiency: Secondary | ICD-10-CM

## 2023-01-16 DIAGNOSIS — E538 Deficiency of other specified B group vitamins: Secondary | ICD-10-CM

## 2023-01-16 DIAGNOSIS — R200 Anesthesia of skin: Secondary | ICD-10-CM

## 2023-01-16 NOTE — Progress Notes (Signed)
Full Name: Renee Day Gender: Female MRN #: [id number] Date of Birth: 16-Jan-1971    Visit Date: 01/16/2023 12:14 Age: 52 Years Examining Physician: Dr. Naomie Dean Referring Physician: Dr. Naomie Dean Height: 5 feet 3 inch    History: Numbness tingling burning in both legs and both hands   Summary: EMG/NCS performed on the left upper and right lower extremities. All nerves (as indicated in the following tables) were within normal limits.     Conclusion:  This is a normal study. No electrophysiologic evidence for large fiber polyneuropathy, mononeuropathy, radiculopathy or neurogenic muscle disease.   However, clinically it does appear as patient does have numbness in digits 4 and 5 when she bends her elbows; an explanation could be that the ulnar nerves are still normal at this time because the symptoms are positional and  resolve quickly when she unbends her elbows.  We did review this at length and I advised getting a soft brace to remind her not to bend her elbows.   Her lower extremities' symptoms may be based in her B12 deficiency which can cause a small-fiber neuropathy. Small-fiber neuropathies may evade detection by this exam.Advised to keep supplementing and keep levels above 500.      ------------------------------- Naomie Dean M.D.  [facility] [address] [place], [place] [address] Tel: [phone] Fax: [phone]  Verbal informed consent was obtained from the patient, patient was informed of potential risk of procedure, including bruising, bleeding, hematoma formation, infection, muscle weakness, muscle pain, numbness, among others.        MNC    Nerve / Sites Muscle Latency Ref. Amplitude Ref. Rel Amp Segments Distance Velocity Ref. Area    ms ms mV mV %  cm m/s m/s mVms  L Median - APB     Wrist APB 2.7 ?4.4 8.9 ?4.0 100 Wrist - APB 7   38.4     Upper arm APB 7.3  8.0  89.9 Upper arm - Wrist 26 57 ?49 34.5  L  Ulnar - ADM     Wrist ADM 2.9 ?3.3 6.3 ?6.0 100 Wrist - ADM 7   24.8     B.Elbow ADM 4.8  6.5  103 B.Elbow - Wrist 12 63 ?49 24.3     A.Elbow ADM 7.6  8.3  129 A.Elbow - B.Elbow 17 61 ?49 31.1  R Peroneal - EDB     Ankle EDB 5.2 ?6.5 2.8 ?2.0 100 Ankle - EDB 9   8.8     Fib head EDB 10.5  2.9  103 Fib head - Ankle 25 48 ?44 9.2     Pop fossa EDB 12.3  2.8  95.9 Pop fossa - Fib head 10 55 ?44 10.3         Pop fossa - Ankle      R Tibial - AH     Ankle AH 4.4 ?5.8 6.1 ?4.0 100 Ankle - AH 9   15.7     Pop fossa AH 12.5  5.1  84.2 Pop fossa - Ankle 37 46 ?41 13.3  L Ulnar - FDI     Wrist FDI 4.4 ?4.5 9.3 ?7.0 100 Wrist - FDI 8   27.3     B.Elbow FDI 6.3  9.7  105 B.Elbow - Wrist 12 63 ?49 24.7     A.Elbow FDI 9.5  9.5  97.7 A.Elbow - B.Elbow 19 61 ?49 21.1  A.Elbow - Wrist                   SNC    Nerve / Sites Rec. Site Peak Lat Ref.  Amp Ref. Segments Distance Peak Diff Ref.    ms ms V V  cm ms ms  R Sural - Ankle (Calf) (1)     Calf Ankle 3.9 ?4.4 7 ?6 Calf - Ankle 14    R Superficial peroneal - Ankle     Lat leg Ankle 2.9 ?4.4 6 ?6 Lat leg - Ankle 14    L Median, Ulnar - Transcarpal comparison     Median Palm Wrist 1.9 ?2.2 40 ?35 Median Palm - Wrist 8       Ulnar Palm Wrist 1.8 ?2.2 13 ?12 Ulnar Palm - Wrist 8          Median Palm - Ulnar Palm  0.0 ?0.4  L Median - Orthodromic (Dig II, Mid palm)     Dig II Wrist 2.8 ?3.4 23 ?10 Dig II - Wrist 13    L Ulnar - Orthodromic, (Dig V, Mid palm)     Dig V Wrist 2.9 ?3.1 14 ?5 Dig V - Wrist 58                 F  Wave    Nerve F Lat Ref.   ms ms  L Ulnar - ADM 25.3 ?32.0       H Reflex    Nerve H Lat Lat Hmax   ms ms   Left Right Ref. Left Right Ref.  Tibial - Soleus 30.0 30.0 ?35.0 19.8 29.9 ?35.0         EMG Summary Table    Spontaneous MUAP Recruitment  Muscle IA Fib PSW Fasc Other Amp Dur. Poly Pattern  R. Vastus medialis Normal None None None _______ Normal Normal Normal Normal  R. Gastrocnemius (Medial head)  Normal None None None _______ Normal Normal Normal Normal  R. Tibialis anterior Normal None None None _______ Normal Normal Normal Normal  R. Extensor hallucis longus Normal None None None _______ Normal Normal Normal Normal  R. Biceps femoris (long head) Normal None None None _______ Normal Normal Normal Normal  R. Gluteus maximus Normal None None None _______ Normal Normal Normal Normal  R. Gluteus medius Normal None None None _______ Normal Normal Normal Normal  L. Cervical paraspinals (low) Normal None None None _______ Normal Normal Normal Normal  L. Deltoid Normal None None None _______ Normal Normal Normal Normal  R. Lumbar paraspinals (low) Normal None None None _______ Normal Normal Normal Normal  L. Triceps brachii Normal None None None _______ Normal Normal Normal Normal  L. First dorsal interosseous Normal None None None _______ Normal Normal Normal Normal  L. Flexor digitorum profundus (Ulnar) Normal None None None _______ Normal Normal Normal Normal  L. Pronator teres Normal None None None _______ Normal Normal Normal Normal  L. Opponens pollicis Normal None None None _______ Normal Normal Normal Normal

## 2023-01-20 NOTE — Procedures (Signed)
      Full Name: Renee Day Gender: Female MRN #: [id number] Date of Birth: 11/04/1970    Visit Date: 01/16/2023 12:14 Age: 51 Years Examining Physician: Dr. Breeann Reposa Referring Physician: Dr. Pammy Vesey Height: 5 feet 3 inch    History: Numbness tingling burning in both legs and both hands   Summary: EMG/NCS performed on the left upper and right lower extremities. All nerves (as indicated in the following tables) were within normal limits.     Conclusion:  This is a normal study. No electrophysiologic evidence for large fiber polyneuropathy, mononeuropathy, radiculopathy or neurogenic muscle disease.   However, clinically it does appear as patient does have numbness in digits 4 and 5 when she bends her elbows; an explanation could be that the ulnar nerves are still normal at this time because the symptoms are positional and  resolve quickly when she unbends her elbows.  We did review this at length and I advised getting a soft brace to remind her not to bend her elbows.   Her lower extremities' symptoms may be based in her B12 deficiency which can cause a small-fiber neuropathy. Small-fiber neuropathies may evade detection by this exam.Advised to keep supplementing and keep levels above 500.      ------------------------------- Marshall Roehrich M.D.  [facility] [address], [address] [place], [place] [address] Tel: [phone] Fax: [phone]  Verbal informed consent was obtained from the patient, patient was informed of potential risk of procedure, including bruising, bleeding, hematoma formation, infection, muscle weakness, muscle pain, numbness, among others.        MNC    Nerve / Sites Muscle Latency Ref. Amplitude Ref. Rel Amp Segments Distance Velocity Ref. Area    ms ms mV mV %  cm m/s m/s mVms  L Median - APB     Wrist APB 2.7 ?4.4 8.9 ?4.0 100 Wrist - APB 7   38.4     Upper arm APB 7.3  8.0  89.9 Upper arm - Wrist 26 57 ?49 34.5  L  Ulnar - ADM     Wrist ADM 2.9 ?3.3 6.3 ?6.0 100 Wrist - ADM 7   24.8     B.Elbow ADM 4.8  6.5  103 B.Elbow - Wrist 12 63 ?49 24.3     A.Elbow ADM 7.6  8.3  129 A.Elbow - B.Elbow 17 61 ?49 31.1  R Peroneal - EDB     Ankle EDB 5.2 ?6.5 2.8 ?2.0 100 Ankle - EDB 9   8.8     Fib head EDB 10.5  2.9  103 Fib head - Ankle 25 48 ?44 9.2     Pop fossa EDB 12.3  2.8  95.9 Pop fossa - Fib head 10 55 ?44 10.3         Pop fossa - Ankle      R Tibial - AH     Ankle AH 4.4 ?5.8 6.1 ?4.0 100 Ankle - AH 9   15.7     Pop fossa AH 12.5  5.1  84.2 Pop fossa - Ankle 37 46 ?41 13.3  L Ulnar - FDI     Wrist FDI 4.4 ?4.5 9.3 ?7.0 100 Wrist - FDI 8   27.3     B.Elbow FDI 6.3  9.7  105 B.Elbow - Wrist 12 63 ?49 24.7     A.Elbow FDI 9.5  9.5  97.7 A.Elbow - B.Elbow 19 61 ?49 21.1           A.Elbow - Wrist                   SNC    Nerve / Sites Rec. Site Peak Lat Ref.  Amp Ref. Segments Distance Peak Diff Ref.    ms ms V V  cm ms ms  R Sural - Ankle (Calf) (1)     Calf Ankle 3.9 ?4.4 7 ?6 Calf - Ankle 14    R Superficial peroneal - Ankle     Lat leg Ankle 2.9 ?4.4 6 ?6 Lat leg - Ankle 14    L Median, Ulnar - Transcarpal comparison     Median Palm Wrist 1.9 ?2.2 40 ?35 Median Palm - Wrist 8       Ulnar Palm Wrist 1.8 ?2.2 13 ?12 Ulnar Palm - Wrist 8          Median Palm - Ulnar Palm  0.0 ?0.4  L Median - Orthodromic (Dig II, Mid palm)     Dig II Wrist 2.8 ?3.4 23 ?10 Dig II - Wrist 13    L Ulnar - Orthodromic, (Dig V, Mid palm)     Dig V Wrist 2.9 ?3.1 14 ?5 Dig V - Wrist 11                 F  Wave    Nerve F Lat Ref.   ms ms  L Ulnar - ADM 25.3 ?32.0       H Reflex    Nerve H Lat Lat Hmax   ms ms   Left Right Ref. Left Right Ref.  Tibial - Soleus 30.0 30.0 ?35.0 19.8 29.9 ?35.0         EMG Summary Table    Spontaneous MUAP Recruitment  Muscle IA Fib PSW Fasc Other Amp Dur. Poly Pattern  R. Vastus medialis Normal None None None _______ Normal Normal Normal Normal  R. Gastrocnemius (Medial head)  Normal None None None _______ Normal Normal Normal Normal  R. Tibialis anterior Normal None None None _______ Normal Normal Normal Normal  R. Extensor hallucis longus Normal None None None _______ Normal Normal Normal Normal  R. Biceps femoris (long head) Normal None None None _______ Normal Normal Normal Normal  R. Gluteus maximus Normal None None None _______ Normal Normal Normal Normal  R. Gluteus medius Normal None None None _______ Normal Normal Normal Normal  L. Cervical paraspinals (low) Normal None None None _______ Normal Normal Normal Normal  L. Deltoid Normal None None None _______ Normal Normal Normal Normal  R. Lumbar paraspinals (low) Normal None None None _______ Normal Normal Normal Normal  L. Triceps brachii Normal None None None _______ Normal Normal Normal Normal  L. First dorsal interosseous Normal None None None _______ Normal Normal Normal Normal  L. Flexor digitorum profundus (Ulnar) Normal None None None _______ Normal Normal Normal Normal  L. Pronator teres Normal None None None _______ Normal Normal Normal Normal  L. Opponens pollicis Normal None None None _______ Normal Normal Normal Normal        

## 2023-01-24 LAB — MULTIPLE MYELOMA PANEL, SERUM
Albumin SerPl Elph-Mcnc: 3.1 g/dL (ref 2.9–4.4)
Albumin/Glob SerPl: 0.9 (ref 0.7–1.7)
Alpha 1: 0.2 g/dL (ref 0.0–0.4)
Alpha2 Glob SerPl Elph-Mcnc: 0.7 g/dL (ref 0.4–1.0)
B-Globulin SerPl Elph-Mcnc: 1.1 g/dL (ref 0.7–1.3)
Gamma Glob SerPl Elph-Mcnc: 1.5 g/dL (ref 0.4–1.8)
Globulin, Total: 3.6 g/dL (ref 2.2–3.9)
IgA/Immunoglobulin A, Serum: 208 mg/dL (ref 87–352)
IgG (Immunoglobin G), Serum: 1591 mg/dL (ref 586–1602)
IgM (Immunoglobulin M), Srm: 109 mg/dL (ref 26–217)
Total Protein: 6.7 g/dL (ref 6.0–8.5)

## 2023-01-24 LAB — INTRINSIC FACTOR ANTIBODIES: Intrinsic Factor Abs, Serum: 1 AU/mL (ref 0.0–1.1)

## 2023-01-24 LAB — ANTI-PARIETAL ANTIBODY: Parietal Cell Ab: 1.2 Units (ref 0.0–20.0)

## 2023-01-24 LAB — VITAMIN B6: Vitamin B6: 7.5 ug/L (ref 3.4–65.2)

## 2023-01-24 LAB — METHYLMALONIC ACID, SERUM: Methylmalonic Acid: 111 nmol/L (ref 0–378)

## 2023-01-24 LAB — VITAMIN B12: Vitamin B-12: 425 pg/mL (ref 232–1245)

## 2023-01-29 ENCOUNTER — Encounter: Payer: Self-pay | Admitting: *Deleted

## 2023-01-30 NOTE — Telephone Encounter (Signed)
Based on available data, no need for MRI.

## 2023-03-26 ENCOUNTER — Ambulatory Visit: Payer: 59 | Admitting: Family Medicine

## 2023-03-26 ENCOUNTER — Encounter: Payer: Self-pay | Admitting: Family Medicine

## 2023-03-26 VITALS — BP 104/78 | HR 75 | Temp 98.9°F | Ht 63.0 in | Wt 224.0 lb

## 2023-03-26 DIAGNOSIS — H66001 Acute suppurative otitis media without spontaneous rupture of ear drum, right ear: Secondary | ICD-10-CM | POA: Diagnosis not present

## 2023-03-26 DIAGNOSIS — R42 Dizziness and giddiness: Secondary | ICD-10-CM

## 2023-03-26 MED ORDER — AMOXICILLIN-POT CLAVULANATE 500-125 MG PO TABS: 1.0000 | ORAL_TABLET | Freq: Two times a day (BID) | ORAL | 0 refills | Status: AC

## 2023-03-26 MED ORDER — SCOPOLAMINE 1 MG/3DAYS TD PT72: 1.0000 | MEDICATED_PATCH | TRANSDERMAL | 0 refills | Status: DC

## 2023-03-26 NOTE — Progress Notes (Signed)
Established Patient Office Visit   Subjective  Patient ID: Renee Day, female    DOB: [birth year]  Age: 52 y.o. MRN: [id number]  Chief Complaint  Patient presents with   Dizziness    Noted since 6am today, also states she felt as "if the room was spinning"   Ear Pain    Patient complains of right ear pain since last night, used Tylenol with no relief   Headache    Noted since last night, used Tylenol with no relief    Patient is a 52 year old female who is seen for acute concern.  Patient endorses mild headache last night and right ear pain.  Patient states she woke up this morning with dizziness and nausea.  Laid in bed and tried to close her eyes the dizziness continued.  Felt off balance like the room was spinning when tried to ambulate to the restroom.  Patient tried sinus spray, Tylenol for symptoms.  Denies fever, chills, cough, sore throat.  Patient concerned about dizziness is going on a cruise next Monday.  Dizziness Associated symptoms include headaches.  Headache  Associated symptoms include dizziness.    Past Medical History:  Diagnosis Date   Asthma    Blood in stool    Migraine    Thyroid disease    UTI (urinary tract infection)    Vitamin B12 deficiency    Past Surgical History:  Procedure Laterality Date   COLONOSCOPY     Social History   Tobacco Use   Smoking status: Never   Smokeless tobacco: Never  Vaping Use   Vaping status: Never Used  Substance Use Topics   Alcohol use: Not Currently   Drug use: Never   Family History  Problem Relation Age of Onset   Cancer Mother    Diabetes Mother    Depression Father    Diabetes Father    Arthritis Sister    Depression Sister    Arthritis Maternal Grandmother    Diabetes Maternal Grandmother    Esophageal cancer Maternal Grandfather    COPD Maternal Grandfather    Early death Maternal Grandfather    Diabetes Paternal Grandmother    Depression Paternal Grandmother    Arthritis Paternal  Grandmother    Thyroid disease Neg Hx    Colon cancer Neg Hx    Stomach cancer Neg Hx    Neuropathy Neg Hx    Allergies  Allergen Reactions   Latex Itching      Review of Systems  Neurological:  Positive for dizziness and headaches.   Negative unless stated above    Objective:     BP 104/78 (BP Location: Left Arm, Patient Position: Sitting, Cuff Size: Large)   Pulse 75   Temp 98.9 F (37.2 C) (Oral)   Ht 5\' 3"  (1.6 m)   Wt 224 lb (101.6 kg)   LMP 03/07/2023 (Exact Date)   SpO2 98%   BMI 39.68 kg/m  BP Readings from Last 3 Encounters:  03/26/23 104/78  01/16/23 120/79  12/25/22 122/78   Wt Readings from Last 3 Encounters:  03/26/23 224 lb (101.6 kg)  01/16/23 211 lb (95.7 kg)  12/25/22 213 lb (96.6 kg)      Physical Exam Constitutional:      General: She is not in acute distress.    Appearance: Normal appearance.  HENT:     Head: Normocephalic and atraumatic.     Right Ear: Tympanic membrane is erythematous.     Ears:  Comments: Left TM full.  Right TM full with erythema, scant suppurative fluid.    Nose: Nose normal.     Mouth/Throat:     Mouth: Mucous membranes are moist.  Cardiovascular:     Rate and Rhythm: Normal rate and regular rhythm.     Heart sounds: Normal heart sounds. No murmur heard.    No gallop.  Pulmonary:     Effort: Pulmonary effort is normal. No respiratory distress.     Breath sounds: Normal breath sounds. No wheezing, rhonchi or rales.  Skin:    General: Skin is warm and dry.  Neurological:     Mental Status: She is alert and oriented to person, place, and time.      No results found for any visits on 03/26/23.    Assessment & Plan:  Acute suppurative otitis media of right ear without spontaneous rupture of tympanic membrane, recurrence not specified -     Amoxicillin-Pot Clavulanate; Take 1 tablet by mouth in the morning and at bedtime for 7 days.  Dispense: 14 tablet; Refill: 0  Vertigo -     Scopolamine; Place 1  patch (1.5 mg total) onto the skin every 3 (three) days.  Dispense: 3 patch; Refill: 0  AOM likely causing vertigo symptoms.  Start ABX for AOM.  Continue supportive care including sinus rinse/allergy medications.  Scopolamine patch given for upcoming cruise.  Given strict precautions.  Return if symptoms worsen or fail to improve.   Deeann Saint, MD

## 2023-04-09 ENCOUNTER — Encounter: Payer: Self-pay | Admitting: Family Medicine

## 2023-04-21 ENCOUNTER — Encounter: Payer: Self-pay | Admitting: Family Medicine

## 2023-04-21 ENCOUNTER — Ambulatory Visit: Payer: 59 | Admitting: Family Medicine

## 2023-04-21 VITALS — BP 112/78 | HR 76 | Temp 98.8°F | Wt 225.4 lb

## 2023-04-21 DIAGNOSIS — H65191 Other acute nonsuppurative otitis media, right ear: Secondary | ICD-10-CM

## 2023-04-21 DIAGNOSIS — H8113 Benign paroxysmal vertigo, bilateral: Secondary | ICD-10-CM

## 2023-04-21 DIAGNOSIS — Z23 Encounter for immunization: Secondary | ICD-10-CM | POA: Diagnosis not present

## 2023-04-21 DIAGNOSIS — Z7184 Encounter for health counseling related to travel: Secondary | ICD-10-CM | POA: Diagnosis not present

## 2023-04-21 DIAGNOSIS — J45990 Exercise induced bronchospasm: Secondary | ICD-10-CM | POA: Diagnosis not present

## 2023-04-21 DIAGNOSIS — Z2989 Encounter for other specified prophylactic measures: Secondary | ICD-10-CM

## 2023-04-21 MED ORDER — TYPHOID VACCINE PO CPDR: 1.0000 | DELAYED_RELEASE_CAPSULE | ORAL | 0 refills | Status: DC

## 2023-04-21 MED ORDER — ALBUTEROL SULFATE HFA 108 (90 BASE) MCG/ACT IN AERS: 2.0000 | INHALATION_SPRAY | Freq: Four times a day (QID) | RESPIRATORY_TRACT | 5 refills | Status: AC | PRN

## 2023-04-21 MED ORDER — FLUTICASONE PROPIONATE 50 MCG/ACT NA SUSP: 1.0000 | Freq: Every day | NASAL | 6 refills | Status: DC

## 2023-04-21 MED ORDER — MECLIZINE HCL 50 MG PO TABS
25.0000 mg | ORAL_TABLET | Freq: Two times a day (BID) | ORAL | 0 refills | Status: DC | PRN
Start: 2023-04-21 — End: 2024-04-22

## 2023-04-21 MED ORDER — ATOVAQUONE-PROGUANIL HCL 250-100 MG PO TABS
ORAL_TABLET | ORAL | 0 refills | Status: DC
Start: 2023-04-21 — End: 2023-08-06

## 2023-04-21 NOTE — Progress Notes (Signed)
Established Patient Office Visit   Subjective  Patient ID: Renee Day, female    DOB: Apr 23, 1971  Age: 52 y.o. MRN: [id number]  Chief Complaint  Patient presents with   Medical Management of Chronic Issues    Earache and vertigo is not as bad as last visit, but still feeling the ache and lightheadedness.  Traveling in oct to [place] town, has questions about meds and vaccines     Patient is a 52 year old female seen for follow-up and travel advice.  Patient endorses some improvement in dizziness since last OFV on 03/26/2023.  At that time patient also diagnosed with AOM.  Treated with Augmentin.  Patient endorses still feeling like the room is spinning and ear ache.  Patient not currently taking any antihistamines.  Patient will be traveling with her husband to [place], [place] for 2 weeks in October.  Patient will also spend time on a game reserve in [place].  Patient inquires about any prophylactic medication needed.  Patient previously been to the [place] and 1 other country in [place].  Patient requesting refills on albuterol inhaler and Flonase.    Past Medical History:  Diagnosis Date   Asthma    Blood in stool    Migraine    Thyroid disease    UTI (urinary tract infection)    Vitamin B12 deficiency    Past Surgical History:  Procedure Laterality Date   COLONOSCOPY     Social History   Tobacco Use   Smoking status: Never   Smokeless tobacco: Never  Vaping Use   Vaping status: Never Used  Substance Use Topics   Alcohol use: Not Currently   Drug use: Never   Family History  Problem Relation Age of Onset   Cancer Mother    Diabetes Mother    Depression Father    Diabetes Father    Arthritis Sister    Depression Sister    Arthritis Maternal Grandmother    Diabetes Maternal Grandmother    Esophageal cancer Maternal Grandfather    COPD Maternal Grandfather    Early death Maternal Grandfather    Diabetes Paternal Grandmother    Depression  Paternal Grandmother    Arthritis Paternal Grandmother    Thyroid disease Neg Hx    Colon cancer Neg Hx    Stomach cancer Neg Hx    Neuropathy Neg Hx    Allergies  Allergen Reactions   Latex Itching      ROS Negative unless stated above    Objective:     BP 112/78 (BP Location: Left Arm, Patient Position: Sitting, Cuff Size: Large)   Pulse 76   Temp 98.8 F (37.1 C) (Oral)   Wt 225 lb 6.4 oz (102.2 kg)   LMP 03/07/2023 (Exact Date)   SpO2 97%   BMI 39.93 kg/m  BP Readings from Last 3 Encounters:  04/21/23 112/78  03/26/23 104/78  01/16/23 120/79   Wt Readings from Last 3 Encounters:  04/21/23 225 lb 6.4 oz (102.2 kg)  03/26/23 224 lb (101.6 kg)  01/16/23 211 lb (95.7 kg)      Physical Exam Constitutional:      General: She is not in acute distress.    Appearance: Normal appearance.  HENT:     Head: Normocephalic and atraumatic.     Right Ear: Hearing, ear canal and external ear normal. A middle ear effusion is present.     Left Ear: Hearing, ear canal and external ear normal.  Ears:     Comments: Bilateral TMs full.  Right TM with effusion, no erythema or suppurative fluid.  Epley maneuver performed.    Nose: Nose normal.     Mouth/Throat:     Mouth: Mucous membranes are moist.  Eyes:     General: Lids are normal.     Extraocular Movements: Extraocular movements intact.     Right eye: Nystagmus present.     Left eye: Nystagmus present.     Conjunctiva/sclera: Conjunctivae normal.     Comments: subtle 1-2 beats of nystagmus with right-sided gaze  Cardiovascular:     Rate and Rhythm: Normal rate and regular rhythm.     Heart sounds: Normal heart sounds. No murmur heard.    No gallop.  Pulmonary:     Effort: Pulmonary effort is normal. No respiratory distress.     Breath sounds: Normal breath sounds. No wheezing, rhonchi or rales.  Skin:    General: Skin is warm and dry.  Neurological:     Mental Status: She is alert and oriented to person,  place, and time.      No results found for any visits on 04/21/23.    Assessment & Plan:  Acute effusion of right ear -Residual effects of recent AOM -Start Flonase or other antihistamine to help reduce symptoms -     Fluticasone Propionate; Place 1 spray into both nostrils daily.  Dispense: 16 g; Refill: 6  Exercise-induced asthma -     Albuterol Sulfate HFA; Inhale 2 puffs into the lungs every 6 (six) hours as needed for wheezing or shortness of breath.  Dispense: 8 g; Refill: 5  Travel advice encounter -Yellow fever vaccine not required as staying within [place] and has no layovers to places that are considered endemic. -Discussed obtaining updated COVID and flu vaccines when available at least 2 weeks prior to travel.  Need for immunization against typhoid -     Typhoid Vaccine; Take 1 capsule by mouth every other day.  Dispense: 4 capsule; Refill: 0  Need for malaria prophylaxis -     Atovaquone-Proguanil HCl; Take 1 tab daily starting 1 to 2 days prior to trip.  Continue daily during trip and for 1 wk after returning home.  Dispense: 24 tablet; Refill: 0  Benign paroxysmal positional vertigo due to bilateral vestibular disorder -Symptoms initially started due to right AOM which is since resolved s/p 7-day Augmentin course -As symptoms initially started getting out of bed 1 morning, Epley maneuver performed in clinic.  Patient tolerated well with some improvement in symptoms noted.  Patient can try at home. -Meclizine 25-50 mg twice daily as needed -For continued or worsening symptoms consider ENT referral and/or vestibular rehab.   Return if symptoms worsen or fail to improve.   Deeann Saint, MD

## 2023-04-22 ENCOUNTER — Encounter: Payer: Self-pay | Admitting: Family Medicine

## 2023-07-10 DIAGNOSIS — L249 Irritant contact dermatitis, unspecified cause: Secondary | ICD-10-CM | POA: Insufficient documentation

## 2023-07-11 DIAGNOSIS — L304 Erythema intertrigo: Secondary | ICD-10-CM | POA: Insufficient documentation

## 2023-07-14 DIAGNOSIS — N926 Irregular menstruation, unspecified: Secondary | ICD-10-CM | POA: Insufficient documentation

## 2023-07-14 DIAGNOSIS — N951 Menopausal and female climacteric states: Secondary | ICD-10-CM | POA: Insufficient documentation

## 2023-08-06 ENCOUNTER — Ambulatory Visit: Payer: 59 | Admitting: Family Medicine

## 2023-08-06 ENCOUNTER — Encounter: Payer: Self-pay | Admitting: Family Medicine

## 2023-08-06 VITALS — BP 110/84 | HR 69 | Temp 98.6°F | Ht 63.0 in | Wt 238.6 lb

## 2023-08-06 DIAGNOSIS — E042 Nontoxic multinodular goiter: Secondary | ICD-10-CM | POA: Diagnosis not present

## 2023-08-06 DIAGNOSIS — R635 Abnormal weight gain: Secondary | ICD-10-CM | POA: Diagnosis not present

## 2023-08-06 DIAGNOSIS — Z0001 Encounter for general adult medical examination with abnormal findings: Secondary | ICD-10-CM

## 2023-08-06 DIAGNOSIS — Z0000 Encounter for general adult medical examination without abnormal findings: Secondary | ICD-10-CM

## 2023-08-06 DIAGNOSIS — N951 Menopausal and female climacteric states: Secondary | ICD-10-CM

## 2023-08-06 DIAGNOSIS — R202 Paresthesia of skin: Secondary | ICD-10-CM

## 2023-08-06 DIAGNOSIS — J305 Allergic rhinitis due to food: Secondary | ICD-10-CM | POA: Diagnosis not present

## 2023-08-06 DIAGNOSIS — E538 Deficiency of other specified B group vitamins: Secondary | ICD-10-CM

## 2023-08-06 DIAGNOSIS — G5621 Lesion of ulnar nerve, right upper limb: Secondary | ICD-10-CM

## 2023-08-06 LAB — CBC WITH DIFFERENTIAL/PLATELET
Basophils Absolute: 0 10*3/uL (ref 0.0–0.1)
Basophils Relative: 0.3 % (ref 0.0–3.0)
Eosinophils Absolute: 0.1 10*3/uL (ref 0.0–0.7)
Eosinophils Relative: 1 % (ref 0.0–5.0)
HCT: 40 % (ref 36.0–46.0)
Hemoglobin: 12.6 g/dL (ref 12.0–15.0)
Lymphocytes Relative: 26.1 % (ref 12.0–46.0)
Lymphs Abs: 1.8 10*3/uL (ref 0.7–4.0)
MCHC: 31.4 g/dL (ref 30.0–36.0)
MCV: 86.5 fL (ref 78.0–100.0)
Monocytes Absolute: 0.4 10*3/uL (ref 0.1–1.0)
Monocytes Relative: 5.5 % (ref 3.0–12.0)
Neutro Abs: 4.7 10*3/uL (ref 1.4–7.7)
Neutrophils Relative %: 67.1 % (ref 43.0–77.0)
Platelets: 246 10*3/uL (ref 150.0–400.0)
RBC: 4.63 Mil/uL (ref 3.87–5.11)
RDW: 12.7 % (ref 11.5–15.5)
WBC: 7.1 10*3/uL (ref 4.0–10.5)

## 2023-08-06 LAB — LIPID PANEL
Cholesterol: 202 mg/dL — ABNORMAL HIGH (ref 0–200)
HDL: 56.1 mg/dL (ref >39.00)
LDL Cholesterol: 131 mg/dL — ABNORMAL HIGH (ref 0–99)
NonHDL: 145.67
Total CHOL/HDL Ratio: 4
Triglycerides: 73 mg/dL (ref 0.0–149.0)
VLDL: 14.6 mg/dL (ref 0.0–40.0)

## 2023-08-06 LAB — VITAMIN D 25 HYDROXY (VIT D DEFICIENCY, FRACTURES): VITD: 16.18 ng/mL — ABNORMAL LOW (ref 30.00–100.00)

## 2023-08-06 LAB — HEMOGLOBIN A1C: Hgb A1c MFr Bld: 5.7 % (ref 4.6–6.5)

## 2023-08-06 LAB — COMPREHENSIVE METABOLIC PANEL
ALT: 17 U/L (ref 0–35)
AST: 17 U/L (ref 0–37)
Albumin: 3.9 g/dL (ref 3.5–5.2)
Alkaline Phosphatase: 72 U/L (ref 39–117)
BUN: 12 mg/dL (ref 6–23)
CO2: 31 meq/L (ref 19–32)
Calcium: 9.1 mg/dL (ref 8.4–10.5)
Chloride: 102 meq/L (ref 96–112)
Creatinine, Ser: 0.71 mg/dL (ref 0.40–1.20)
GFR: 99.29 mL/min (ref >60.00)
Glucose, Bld: 87 mg/dL (ref 70–99)
Potassium: 3.8 meq/L (ref 3.5–5.1)
Sodium: 137 meq/L (ref 135–145)
Total Bilirubin: 0.6 mg/dL (ref 0.2–1.2)
Total Protein: 7.2 g/dL (ref 6.0–8.3)

## 2023-08-06 LAB — TSH: TSH: 0.29 u[IU]/mL — ABNORMAL LOW (ref 0.35–5.50)

## 2023-08-06 LAB — VITAMIN B12: Vitamin B-12: 271 pg/mL (ref 211–911)

## 2023-08-06 LAB — T4, FREE: Free T4: 0.98 ng/dL (ref 0.60–1.60)

## 2023-08-06 MED ORDER — FLUTICASONE PROPIONATE 50 MCG/ACT NA SUSP: 1.0000 | Freq: Every day | NASAL | 6 refills | Status: AC

## 2023-08-06 NOTE — Patient Instructions (Addendum)
Referrals for allergy, general surgery and nutrition were placed.  He states that the phone call about scheduling up these appointments.

## 2023-08-06 NOTE — Progress Notes (Signed)
Established Patient Office Visit   Subjective  Patient ID: Renee Day, female    DOB: [birth year]  Age: 52 y.o. MRN: [id number]  Chief Complaint  Patient presents with   Annual Exam   Medical Management of Chronic Issues    Form to be filled out, questions about menopause, patient is seeing OB today, weight, and allergy problems, ear pain, runny nose, headaches, drainage     Patient is a 52 year old female seen for CPE.  Patient endorses increased allergy symptoms.  Typically okay during the day after taking a walk but by night endorses facial edema, pressure, right ear ache.  Using Flonase.  Has not tried OTC antihistamines.  Endorses eating nuts daily.  Drinking almond milk.  Recently felt throat with itchy/"closing" after drinking the almond milk.  Patient also endorses eating a lot of seafood.  Patient notes paresthesias in right fourth and fifth digits. Typically has ergonomic work space modifications, but worked while out of town last wk.  Symptoms started shortly after.  Goiter enlarging.   Occasionally at night patient has difficulty swallowing.  May wake her up.  Ready to consider surgery.  Patient inquires about nutritionist.  States gaining weight.  Also notes mood swings, hot flashes.  LMP 07/27/23.  Has appointment with OB/GYN later today.    Patient Active Problem List   Diagnosis Date Noted   Vitamin D deficiency 06/15/2021   Vitamin B12 deficiency 06/15/2021   Multinodular goiter 02/23/2021   Acute right ankle pain 02/19/2021   Achilles tendinitis, right leg 02/19/2021   Subclinical hyperthyroidism 10/02/2020   Past Medical History:  Diagnosis Date   Asthma    Blood in stool    Migraine    Thyroid disease    UTI (urinary tract infection)    Vitamin B12 deficiency    Past Surgical History:  Procedure Laterality Date   COLONOSCOPY     Social History   Tobacco Use   Smoking status: Never   Smokeless tobacco: Never  Vaping Use   Vaping status:  Never Used  Substance Use Topics   Alcohol use: Not Currently   Drug use: Never   Family History  Problem Relation Age of Onset   Cancer Mother    Diabetes Mother    Depression Father    Diabetes Father    Arthritis Sister    Depression Sister    Arthritis Maternal Grandmother    Diabetes Maternal Grandmother    Esophageal cancer Maternal Grandfather    COPD Maternal Grandfather    Early death Maternal Grandfather    Diabetes Paternal Grandmother    Depression Paternal Grandmother    Arthritis Paternal Grandmother    Thyroid disease Neg Hx    Colon cancer Neg Hx    Stomach cancer Neg Hx    Neuropathy Neg Hx    Allergies  Allergen Reactions   Latex Itching      ROS Negative unless stated above    Objective:     BP 110/84 (BP Location: Right Arm, Patient Position: Sitting, Cuff Size: Large)   Pulse 69   Temp 98.6 F (37 C) (Oral)   Ht 5\' 3"  (1.6 m)   Wt 238 lb 9.6 oz (108.2 kg)   LMP 07/27/2023 (Exact Date)   SpO2 97%   BMI 42.27 kg/m  BP Readings from Last 3 Encounters:  08/06/23 110/84  04/21/23 112/78  03/26/23 104/78   Wt Readings from Last 3 Encounters:  08/06/23 238 lb 9.6 oz (108.2 kg)  04/21/23 225 lb 6.4 oz (102.2 kg)  03/26/23 224 lb (101.6 kg)      Physical Exam Constitutional:      Appearance: Normal appearance.  HENT:     Head: Normocephalic and atraumatic.     Right Ear: Tympanic membrane, ear canal and external ear normal.     Left Ear: Tympanic membrane, ear canal and external ear normal.     Nose: Nose normal.     Mouth/Throat:     Mouth: Mucous membranes are moist.     Pharynx: No oropharyngeal exudate or posterior oropharyngeal erythema.  Eyes:     General: No scleral icterus.    Extraocular Movements: Extraocular movements intact.     Conjunctiva/sclera: Conjunctivae normal.     Pupils: Pupils are equal, round, and reactive to light.  Neck:     Thyroid: Thyromegaly present.      Comments: Goiter right thyroid greater  than left.   Cardiovascular:     Rate and Rhythm: Normal rate and regular rhythm.     Pulses: Normal pulses.     Heart sounds: Normal heart sounds. No murmur heard.    No friction rub.  Pulmonary:     Effort: Pulmonary effort is normal.     Breath sounds: Normal breath sounds. No wheezing, rhonchi or rales.  Abdominal:     General: Bowel sounds are normal.     Palpations: Abdomen is soft.     Tenderness: There is no abdominal tenderness.  Musculoskeletal:        General: No deformity. Normal range of motion.     Left hand: Normal.     Comments: Mild edema of R hand.  No deformities.  Paresthesias of R 4th and 5th digits with palpation and tapping of Guyon's canal. Positive Tinnel's of R wrist.  Lymphadenopathy:     Cervical: No cervical adenopathy.  Skin:    General: Skin is warm and dry.     Findings: No lesion.  Neurological:     General: No focal deficit present.     Mental Status: She is alert and oriented to person, place, and time.  Psychiatric:        Mood and Affect: Mood normal.        Thought Content: Thought content normal.      No results found for any visits on 08/06/23.    Assessment & Plan:  Well adult exam -Age-appropriate health as discussed -Obtain labs -Immunizations reviewed. -Mammogram up-to-date done 11/05/2022. -Colonoscopy up-to-date done 04/04/2022 -Pap up-to-date done 11/2022 with OB/GYN. -Next CPE in 1 year -     CBC with Differential/Platelet; Future -     Comprehensive metabolic panel; Future -     Hemoglobin A1c; Future -     Lipid panel; Future -     TSH; Future  B12 deficiency -     CBC with Differential/Platelet; Future -     Vitamin B12; Future  Nontoxic multinodular goiter -Enlarging.  Discussed surgical removal.  Patient open to seeing a surgeon.  Referral placed. -     TSH; Future -     T4, free -     Ambulatory referral to General Surgery  Guyon syndrome, right -supportive care -     CBC with Differential/Platelet;  Future  Paresthesia of hand, bilateral -Likely 2/2 Guyon canal syndrome and history of carpal tunnel.  Also consider thyroid dysfunction, electrolyte abnormality. -Obtain labs -     CBC with Differential/Platelet; Future -     Comprehensive  metabolic panel; Future  Weight gain -Body mass index is 42.27 kg/m. -discussed possible causes including perimenopause, thyroid dysfunction, etc. -food diary -lifestyle modifications -referral to nutrition. -     Comprehensive metabolic panel; Future -     Hemoglobin A1c; Future -     Lipid panel; Future -     TSH; Future -     VITAMIN D 25 Hydroxy (Vit-D Deficiency, Fractures); Future -     T4, free -     Amb Referral to Nutrition and Diabetic Education  Perimenopause -follow-up with OB/GYN later today. 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  -Food diary. -Patient advised to stop eating all nuts and almond milk to see if she notices improvement in symptoms. -Also consider starting OTC antihistamine such as Zyrtec, Allegra, Claritin, or Xyzal. -Referral to allergist for allergy testing -     Fluticasone Propionate; Place 1 spray into both nostrils daily.  Dispense: 16 g; Refill: 6 -     Ambulatory referral to Allergy   Return in about 6 weeks (around 09/17/2023), or if symptoms worsen or fail to improve.   Deeann Saint, MD

## 2023-08-12 DIAGNOSIS — Z8379 Family history of other diseases of the digestive system: Secondary | ICD-10-CM | POA: Insufficient documentation

## 2023-08-12 DIAGNOSIS — E669 Obesity, unspecified: Secondary | ICD-10-CM | POA: Insufficient documentation

## 2023-08-13 ENCOUNTER — Encounter: Payer: Self-pay | Admitting: Family Medicine

## 2023-08-13 ENCOUNTER — Ambulatory Visit: Payer: 59 | Admitting: Family Medicine

## 2023-08-13 DIAGNOSIS — B0239 Other herpes zoster eye disease: Secondary | ICD-10-CM | POA: Diagnosis not present

## 2023-08-13 DIAGNOSIS — E559 Vitamin D deficiency, unspecified: Secondary | ICD-10-CM

## 2023-08-13 DIAGNOSIS — E0590 Thyrotoxicosis, unspecified without thyrotoxic crisis or storm: Secondary | ICD-10-CM

## 2023-08-13 MED ORDER — VALACYCLOVIR HCL 500 MG PO TABS: 1000.0000 mg | ORAL_TABLET | Freq: Three times a day (TID) | ORAL | 0 refills | Status: AC

## 2023-08-13 MED ORDER — GABAPENTIN 100 MG PO CAPS: 100.0000 mg | ORAL_CAPSULE | Freq: Three times a day (TID) | ORAL | 0 refills | Status: DC | PRN

## 2023-08-13 MED ORDER — VITAMIN D (ERGOCALCIFEROL) 1.25 MG (50000 UNIT) PO CAPS: 50000.0000 [IU] | ORAL_CAPSULE | ORAL | 0 refills | Status: DC

## 2023-08-13 NOTE — Progress Notes (Signed)
Established Patient Office Visit   Subjective  Patient ID: Renee Day, female    DOB: [birth year]  Age: 52 y.o. MRN: [id number]  Chief Complaint  Patient presents with   Rash    Around top of the left eye, started 2 days ago, itchy burning,    Medical Management of Chronic Issues    Low Vit D levels    Patient is a 52 year old female seen for acute concern.  Patient endorses painful, burning, pruritic bumps on left eyelid x 3 days.  Patient states symptoms are similar to prior shingles outbreak.  Skin starting to feel tight on upper eyelid when opening eye completely.  Patient denies changes in vision, eye pain or irritation.  Seen at [facility] ophthalmology.  Patient endorses some increased stress given the holidays.  Was scheduled to be out of town for work trip today but will try going later this week.  Patient has appointment with general surgery in January for multinodular goiter.  Recent labs revealed subclinical hyperthyroidism.  Patient also has an endocrinology appointment in January.  Plans on starting vitamin D supplement.    Patient Active Problem List   Diagnosis Date Noted   Family history of Crohn's disease 08/12/2023   Obesity 08/12/2023   Irregular periods 07/14/2023   Menopausal symptom 07/14/2023   Erythema intertrigo 07/11/2023   Irritant dermatitis 07/10/2023   Vitamin D deficiency 06/15/2021   Vitamin B12 deficiency 06/15/2021   Multinodular goiter 02/23/2021   Achilles tendinitis, right leg 02/19/2021   Subclinical hyperthyroidism 10/02/2020   Asthma 05/11/2018   Goiter 05/11/2018   Uterine leiomyoma 05/11/2018   Past Medical History:  Diagnosis Date   Asthma    Blood in stool    Migraine    Thyroid disease    UTI (urinary tract infection)    Vitamin B12 deficiency    Past Surgical History:  Procedure Laterality Date   COLONOSCOPY     Social History   Tobacco Use   Smoking status: Never   Smokeless tobacco: Never  Vaping Use    Vaping status: Never Used  Substance Use Topics   Alcohol use: Not Currently   Drug use: Never   Family History  Problem Relation Age of Onset   Cancer Mother    Diabetes Mother    Depression Father    Diabetes Father    Arthritis Sister    Depression Sister    Arthritis Maternal Grandmother    Diabetes Maternal Grandmother    Esophageal cancer Maternal Grandfather    COPD Maternal Grandfather    Early death Maternal Grandfather    Diabetes Paternal Grandmother    Depression Paternal Grandmother    Arthritis Paternal Grandmother    Thyroid disease Neg Hx    Colon cancer Neg Hx    Stomach cancer Neg Hx    Neuropathy Neg Hx    Allergies  Allergen Reactions   Latex Itching      ROS Negative unless stated above    Objective:     LMP 07/27/2023 (Exact Date)  BP Readings from Last 3 Encounters:  08/06/23 110/84  04/21/23 112/78  03/26/23 104/78   Wt Readings from Last 3 Encounters:  08/06/23 238 lb 9.6 oz (108.2 kg)  04/21/23 225 lb 6.4 oz (102.2 kg)  03/26/23 224 lb (101.6 kg)      Physical Exam Constitutional:      Appearance: Normal appearance.  HENT:     Head: Normocephalic and atraumatic.     Nose:  Nose normal.     Mouth/Throat:     Mouth: Mucous membranes are moist.  Eyes:     Extraocular Movements: Extraocular movements intact.     Conjunctiva/sclera: Conjunctivae normal.     Pupils: Pupils are equal, round, and reactive to light.  Cardiovascular:     Rate and Rhythm: Normal rate.  Pulmonary:     Effort: Pulmonary effort is normal.  Skin:    General: Skin is warm and dry.     Findings: Rash present.     Comments: 2 pustules L upper eyelid in linear distribution.  No edema, erythema, or drainage.    Neurological:     Mental Status: She is alert and oriented to person, place, and time. Mental status is at baseline.     No results found for any visits on 08/13/23.    Assessment & Plan:  Shingles of eyelid -currently without vision  changes.  [facility] contacted.  Pt to proceed to their office now for further eval. -Rx for Valtrex and gabapentin sent to pharmacy -Consider shingles vaccine after resolution of current symptoms -     valACYclovir HCl; Take 2 tablets (1,000 mg total) by mouth 3 (three) times daily for 7 days.  Dispense: 42 tablet; Refill: 0 -     Gabapentin; Take 1 capsule (100 mg total) by mouth 3 (three) times daily as needed (burning senation, pain of rash).  Dispense: 60 capsule; Refill: 0  Vitamin D deficiency - vitamin D 16.18 on 08/06/2023 -     Vitamin D (Ergocalciferol); Take 1 capsule (50,000 Units total) by mouth every 7 (seven) days.  Dispense: 12 capsule; Refill: 0  Subclinical hyperthyroidism -History of multinodular goiter -TSH 0.29 and free T40.98 On 08/06/2023 -Encouraged to keep upcoming appointment with surgery regarding thyroidectomy.  Also has appointment with Endo next month. -Given strict precautions   Return if symptoms worsen or fail to improve.   Deeann Saint, MD

## 2023-09-04 ENCOUNTER — Ambulatory Visit: Payer: 59 | Admitting: Internal Medicine

## 2023-09-04 ENCOUNTER — Encounter: Payer: Self-pay | Admitting: Internal Medicine

## 2023-09-04 VITALS — BP 122/70 | HR 72 | Ht 63.0 in | Wt 234.6 lb

## 2023-09-04 DIAGNOSIS — E042 Nontoxic multinodular goiter: Secondary | ICD-10-CM

## 2023-09-04 DIAGNOSIS — E0590 Thyrotoxicosis, unspecified without thyrotoxic crisis or storm: Secondary | ICD-10-CM

## 2023-09-04 NOTE — Progress Notes (Addendum)
 Patient ID: Renee Day, female   DOB: 1971-01-12, 53 y.o.   MRN: [id number]  HPI  Renee Day is a 53 y.o.-year-old-year-old female, returning for follow-up for multinodular goiter and history of thyrotoxicosis.  She previously saw Dr. Kassie, but last visit with me 1 year ago.  Interim history: She had shingles - L eye - the 3rd time in last 6-7 years. On steroid eyedrops. She recently saw her PCP and at that time, she mentioned that she felt that her right thyroid  nodule had increased in size.  At that time, she was referred to surgery.  She has an appointment with Dr. Eletha coming up next week.  Multinodular goiter: - dx 2009  Thyroid  U/S results and records are benign thyroid  biopsy from 2009 are not available.  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   The remaining right thyroid  gland is diffusely enlarged and lobular with multiple areas of pseudo nodularity. No additional discrete nodules are identified separate from the background thyroid  parenchyma. No suspicious features.   IMPRESSION: Diffusely heterogeneous, lobular and asymmetrically enlarged (right larger than left) thyroid  gland.     Large 3.6 cm spongiform nodule in the right upper gland was reportedly previously biopsied. Recommend correlation with prior biopsy results. From a morphologic perspective, a spongiform configuration  is typically considered benign/low risk.   No additional discrete nodules or suspicious features identified.  Pt denies: - hoarseness - dysphagia - choking  But she does feel that her nodule is larger and causing some pressure.  Thyrotoxicosis: - dx 2021  I reviewed pt's thyroid  tests: Lab Results  Component Value Date   TSH 0.29 (L) 08/06/2023   TSH 0.53 08/19/2022   TSH 0.51 06/19/2022   TSH 0.43 12/05/2021   TSH 0.18 (L) 06/15/2021   TSH 0.53 01/19/2021   TSH 0.44 10/02/2020   TSH 0.76 06/14/2020   TSH 0.40 06/10/2019   FREET4 0.98 08/06/2023   FREET4 0.93 06/19/2022   FREET4 1.01 12/05/2021   FREET4 0.91 06/15/2021   FREET4 0.78 10/02/2020   FREET4 1.1 06/14/2020   FREET4 0.87 06/10/2019    Lab Results  Component Value Date   T3FREE 3.0 06/19/2022   She mentions: - heat intolerance - palpitations  No: - weight loss - insomnia - tremors - diarrhea  + FH of thyroid  ds. : cousin with goiter. No FH of thyroid  cancer. No h/o radiation tx to head or neck. No recent contrast studies. No steroid use. No herbal supplements. No Biotin supplements or Hair, Skin and Nails vitamins.  Pt also has a history of type 2 diabetes per review of the chart, however, latest HbA1c levels have been within the target range: Lab Results  Component Value Date   HGBA1C 5.7 08/06/2023   HGBA1C 5.6 06/19/2022   HGBA1C 5.5 06/15/2021   HGBA1C 5.4 02/23/2021   HGBA1C 5.4 06/14/2020   HGBA1C 5.5 06/10/2019   She also has a history of vitamin D   and B12 deficiency.  ROS:  + See HPI   Past Medical History:  Diagnosis Date   Asthma    Blood in stool    Migraine    Thyroid  disease    UTI (urinary tract infection)    Vitamin B12 deficiency    Past Surgical History:  Procedure Laterality Date   COLONOSCOPY     Social History   Socioeconomic History   Marital status: Married    Spouse name: Not on file   Number of children: 0   Years of education: Not on file   Highest  education level: Master's degree (e.g., MA, MS, MEng, MEd, MSW, MBA)  Occupational History   Not on file  Tobacco Use   Smoking status: Never   Smokeless tobacco: Never  Vaping Use   Vaping status: Never Used  Substance and Sexual Activity   Alcohol use: Not Currently   Drug use: Never   Sexual activity: Yes  Other Topics Concern   Not on file  Social History Narrative   Not on file   Social Drivers of Health   Financial Resource Strain: Low Risk  (08/05/2023)   Overall Financial Resource Strain (CARDIA)    Difficulty of Paying Living Expenses: Not hard at all  Food Insecurity: No Food Insecurity (08/05/2023)   Hunger Vital Sign    Worried About Running Out of Food in the Last Year: Never true    Ran Out of Food in the Last Year: Never true  Transportation Needs: No Transportation Needs (08/05/2023)   PRAPARE - Administrator, Civil Service (Medical): No    Lack of Transportation (Non-Medical): No  Physical Activity: Unknown (08/05/2023)   Exercise Vital Sign    Days of Exercise per Week: 0 days    Minutes of Exercise per Session: Not on file  Stress: No Stress Concern Present (08/05/2023)   [employer] of Occupational Health - Occupational Stress Questionnaire    Feeling of Stress : Only a little  Social Connections: Socially Integrated (08/05/2023)   Social Connection and Isolation Panel [NHANES]    Frequency of Communication with Friends and Family: More than three times a week    Frequency of Social Gatherings with Friends and Family: Once a week    Attends Religious Services: More than 4 times per year    Active Member of [employer] or Organizations: Yes    Attends Engineer, Structural: More than 4 times per year    Marital Status: Married  Catering Manager Violence: Not on file   Current Outpatient Medications on File Prior to Visit  Medication Sig Dispense Refill   albuterol  (VENTOLIN  HFA) 108 (90 Base) MCG/ACT inhaler Inhale 2 puffs into the  lungs every 6 (six) hours as needed for wheezing or shortness of breath. 8 g 5   atovaquone -proguanil (MALARONE ) 250-100 MG TABS tablet PLEASE SEE ATTACHED FOR DETAILED DIRECTIONS     Cholecalciferol (D3-50) 1.25 MG (50000 UT) capsule Take 50,000 Units by mouth daily.     cyanocobalamin  1000 MCG tablet Take 1,000 mcg by mouth daily.     cyclobenzaprine  (FLEXERIL ) 5 MG tablet Take up to 3 times a day as needed for symptoms related to sciatica. 30 tablet 1   fluticasone  (FLONASE ) 50 MCG/ACT nasal spray Place 1 spray into both nostrils daily. 16 g 6   gabapentin  (NEURONTIN ) 100 MG capsule Take 1 capsule (100 mg total) by mouth 3 (three) times daily as needed (burning senation, pain of rash). 60 capsule  0   meclizine  (ANTIVERT ) 50 MG tablet Take 0.5-1 tablets (25-50 mg total) by mouth 2 (two) times daily as needed for dizziness. May cause drowsiness. 30 tablet 0   Multiple Vitamins-Minerals (VITAMIN D3 COMPLETE PO) Take 1 capsule by mouth every 7 (seven) days.     scopolamine  (TRANSDERM-SCOP) 1 MG/3DAYS PLACE 1 PATCH ONTO THE SKIN EVERY 3 DAYS.     Vitamin D , Ergocalciferol , (DRISDOL ) 1.25 MG (50000 UNIT) CAPS capsule Take 1 capsule (50,000 Units total) by mouth every 7 (seven) days. 12 capsule 0   No current facility-administered medications on file prior to visit.   Allergies  Allergen Reactions   Latex Itching   Family History  Problem Relation Age of Onset   Cancer Mother    Diabetes Mother    Depression Father    Diabetes Father    Arthritis Sister    Depression Sister    Arthritis Maternal Grandmother    Diabetes Maternal Grandmother    Esophageal cancer Maternal Grandfather    COPD Maternal Grandfather    Early death Maternal Grandfather    Diabetes Paternal Grandmother    Depression Paternal Grandmother    Arthritis Paternal Grandmother    Thyroid  disease Neg Hx    Colon cancer Neg Hx    Stomach cancer Neg Hx    Neuropathy Neg Hx    PE: BP 122/70   Pulse 72   Ht 5' 3  (1.6 m)   Wt 234 lb 9.6 oz (106.4 kg)   LMP 07/27/2023 (Exact Date)   SpO2 97%   BMI 41.56 kg/m  Wt Readings from Last 3 Encounters:  09/04/23 234 lb 9.6 oz (106.4 kg)  08/06/23 238 lb 9.6 oz (108.2 kg)  04/21/23 225 lb 6.4 oz (102.2 kg)   Constitutional: overweight, in NAD Eyes:  EOMI, no exophthalmos ENT: + Significant right thyromegaly, no cervical lymphadenopathy Cardiovascular: RRR, No MRG Respiratory: CTA B Musculoskeletal: no deformities Skin:no rashes Neurological: no tremor with outstretched hands  ASSESSMENT: 1.  Multinodular goiter  2.  History of thyrotoxicosis  PLAN: 1. Thyroid  nodules -Patient with a history of multinodular goiter, with a dominant large right thyroid  nodule, without worrisome features: The nodule did not have hypoechogenicity, microcalcifications, internal blood flow, taller than wide distribution or irregular contours.  Moreover, it appears to be spongiform, which can present very low risk of cancer and is essentially considered a benign lesion.  Also, the nodule has been biopsied in the past with benign results.  On the latest ultrasound from 2022, the nodule appeared to measure 3.4 x 2 x 3.6 cm.  She also had other, smaller, thyroid  nodules previously, but on the latest ultrasound, these appeared more like areas of pseudonodularity, inflammatory nodules, which are benign. -Patient does not have a thyroid  cancer family history or personal history of radiation therapy to head or neck to increase her own risk of thyroid  cancer -Since last visit, she feels that her thyroid  nodule has increased in size and causes some neck pressure.  She does have significant right thyromegaly on exam.  She was seen by PCP less than a month ago and was referred to surgery.  She has an appointment with Dr. Eletha coming up next week. -At today's visit we discussed about the fact that especially as the nodule is bothersome to her, we can contemplate right thyroidectomy.   However, I did suggest another ultrasound first.  Will order this today. -If she does undergo right hemithyroidectomy, we discussed about coming back for  thyroid  tests in approximately 1 month after the surgery -I will see her back in 6 months  2.  History of thyrotoxicosis -She had a low TSH in 2022 -TFTs were normal in 08/2022, however, she had a more recent TSH that was slightly low, at 0.29, 4 weeks ago.  At that time, she was on Flonase , used for allergies and congestion.  It is possible that this could have influenced the results. -At today's visit, we will recheck her TFTs and TSI antibodies. -If the TSH is still only mildly low, we discussed that no intervention is needed.  However, if trending down and free thyroid  hormones also become abnormal, she may need intervention with methimazole, RAI treatment, or, surgery.  If she undergoes hemithyroidectomy for her thyroid  nodule, this may help with thyrotoxicosis.  However, we may need a thyroid  uptake and scan first to see if the increased uptake localizes at the right lobe.  I explained what this entails.  Orders Placed This Encounter  Procedures   US  THYROID    TSH   T3, free   T4, free   Thyroid  stimulating immunoglobulin   TSH   T4, free   T3, free   Component     Latest Ref Rng 09/04/2023  TSH     mIU/L 0.22 (L)   T4,Free(Direct)     0.8 - 1.8 ng/dL 1.1   Triiodothyronine,Free,Serum     2.3 - 4.2 pg/mL 3.2   Mild subclinical thyrotoxicosis.  I would not suggest intervention for now especially pending a possible thyroid  surgery.    Component     Latest Ref Rng 09/04/2023  TSI     <140 % baseline <89   TSI's are not elevated.  Thyroid  U/S (09/05/2023): Parenchymal Echotexture: Mildly heterogenous Isthmus: Normal in size measuring 0.6 cm in diameter, unchanged Right lobe: Enlarged measuring 10.5 x 6.0 x 5.9 cm, previously, 9.4 x 5.8 x 4.4 cm Left lobe: Borderline enlarged measuring 6.1 x 1.9 x 1.3 cm, previously, 6.1 x 2.1  x 1.5 cm _________________________________________________________   Estimated total number of nodules >/= 1 cm: 1 _________________________________________________________   The 3.1 x 3.0 x 2.0 cm spongiform/benign-appearing nodule within the superior pole of the right lobe of the thyroid  (labeled 1), is unchanged to decreased in size compared to the 01/2021 examination, previously, 3.6 x 3.4 x 2.0 cm. By report, this nodule was biopsied at an outside institution. _________________________________________________________   There is a 0.8 cm isoechoic nodule within the mid aspect of the left lobe of the thyroid  (labeled 2), not definitively seen on the 2022 examination, though does not meet criteria to recommend percutaneous sampling or continued dedicated follow-up.   There is a punctate (0.9 cm) benign colloid containing cyst within the inferior pole of the left lobe of the thyroid  (labeled 3), which does not meet imaging criteria to recommend percutaneous sampling or continued dedicated follow-up.   IMPRESSION: 1. Similar findings of thyromegaly and multinodular goiter. No worrisome new or enlarging thyroid  nodules. 2. Dominant 3.1 cm spongiform/benign-appearing nodule within the superior pole of the right lobe of the thyroid  is unchanged to decreased in size compared to the 01/2021 examination, previously, 3.6 cm. By report, this nodule was biopsied at an outside institution. If so, correlation with previous biopsy results is advised. Regardless, this nodule does not meet criteria to recommend percutaneous sampling or continued dedicated follow-up. 3. None of the additional discretely measured thyroid  nodules/cysts meet imaging criteria to recommend percutaneous sampling or continued dedicated follow-up.  Lela  Trixie, MD PhD [facility]

## 2023-09-04 NOTE — Patient Instructions (Addendum)
 Please stop at the lab.  Let's get another thyroid U/S.  Please return in 6 months.

## 2023-09-05 ENCOUNTER — Ambulatory Visit
Admission: RE | Admit: 2023-09-05 | Discharge: 2023-09-05 | Disposition: A | Discharge status: Home / Self Care | Payer: 59 | Source: Ambulatory Visit | Attending: Internal Medicine | Admitting: Internal Medicine

## 2023-09-05 DIAGNOSIS — E042 Nontoxic multinodular goiter: Secondary | ICD-10-CM

## 2023-09-08 ENCOUNTER — Encounter: Payer: Self-pay | Admitting: Internal Medicine

## 2023-09-08 LAB — T4, FREE: Free T4: 1.1 ng/dL (ref 0.8–1.8)

## 2023-09-08 LAB — THYROID STIMULATING IMMUNOGLOBULIN: TSI: <89 %{baseline} (ref <140)

## 2023-09-08 LAB — TSH: TSH: 0.22 m[IU]/L — ABNORMAL LOW

## 2023-09-08 LAB — T3, FREE: T3, Free: 3.2 pg/mL (ref 2.3–4.2)

## 2023-09-12 ENCOUNTER — Other Ambulatory Visit: Payer: Self-pay | Admitting: Obstetrics and Gynecology

## 2023-09-16 LAB — SURGICAL PATHOLOGY

## 2023-10-01 ENCOUNTER — Encounter: Payer: 59 | Attending: Family Medicine | Admitting: Dietician

## 2023-10-01 VITALS — Wt 233.2 lb

## 2023-10-01 DIAGNOSIS — Z6841 Body Mass Index (BMI) 40.0 and over, adult: Secondary | ICD-10-CM | POA: Insufficient documentation

## 2023-10-01 DIAGNOSIS — E66813 Obesity, class 3: Secondary | ICD-10-CM | POA: Insufficient documentation

## 2023-10-01 DIAGNOSIS — N951 Menopausal and female climacteric states: Secondary | ICD-10-CM | POA: Diagnosis present

## 2023-10-01 NOTE — Progress Notes (Signed)
Medical Nutrition Therapy  Appointment Start time:  [phone]  Appointment End time:  0905  Primary concerns today: weight reductions  Referral diagnosis: Weight gain, Perimenopause   Preferred learning style:  no preference indicated) Learning readiness:  ready- change in progress)   NUTRITION ASSESSMENT    Clinical Medical Hx:  Past Medical History:  Diagnosis Date   Asthma    Blood in stool    Migraine    Thyroid disease    UTI (urinary tract infection)    Vitamin B12 deficiency     Medications:  Current Outpatient Medications:    prednisoLONE acetate (PRED FORTE) 1 % ophthalmic suspension, Place into the left eye., Disp: , Rfl:    valACYclovir (VALTREX) 1000 MG tablet, Take 1,000 mg by mouth daily., Disp: , Rfl:    Vitamin D, Ergocalciferol, (DRISDOL) 1.25 MG (50000 UNIT) CAPS capsule, Take 1 capsule (50,000 Units total) by mouth every 7 (seven) days., Disp: 12 capsule, Rfl: 0   albuterol (VENTOLIN HFA) 108 (90 Base) MCG/ACT inhaler, Inhale 2 puffs into the lungs every 6 (six) hours as needed for wheezing or shortness of breath., Disp: 8 g, Rfl: 5   atovaquone-proguanil (MALARONE) 250-100 MG TABS tablet, PLEASE SEE ATTACHED FOR DETAILED DIRECTIONS (Patient not taking: Reported on 09/04/2023), Disp: , Rfl:    Cholecalciferol (D3-50) 1.25 MG (50000 UT) capsule, Take 50,000 Units by mouth daily., Disp: , Rfl:    cyanocobalamin 1000 MCG tablet, Take 1,000 mcg by mouth daily. (Patient not taking: Reported on 09/04/2023), Disp: , Rfl:    cyclobenzaprine (FLEXERIL) 5 MG tablet, Take up to 3 times a day as needed for symptoms related to sciatica. (Patient not taking: Reported on 09/04/2023), Disp: 30 tablet, Rfl: 1   fluticasone (FLONASE) 50 MCG/ACT nasal spray, Place 1 spray into both nostrils daily. (Patient taking differently: Place 1 spray into both nostrils daily.), Disp: 16 g, Rfl: 6   gabapentin (NEURONTIN) 100 MG capsule, Take 1 capsule (100 mg total) by mouth 3 (three) times daily as  needed (burning senation, pain of rash). (Patient not taking: Reported on 09/04/2023), Disp: 60 capsule, Rfl: 0   meclizine (ANTIVERT) 50 MG tablet, Take 0.5-1 tablets (25-50 mg total) by mouth 2 (two) times daily as needed for dizziness. May cause drowsiness. (Patient not taking: Reported on 09/04/2023), Disp: 30 tablet, Rfl: 0   Multiple Vitamins-Minerals (VITAMIN D3 COMPLETE PO), Take 1 capsule by mouth every 7 (seven) days. (Patient not taking: Reported on 10/01/2023), Disp: , Rfl:    scopolamine (TRANSDERM-SCOP) 1 MG/3DAYS, PLACE 1 PATCH ONTO THE SKIN EVERY 3 DAYS. (Patient not taking: Reported on 09/04/2023), Disp: , Rfl:   Labs:  Lab Results  Component Value Date   HGBA1C 5.7 08/06/2023   Lab Results  Component Value Date   CHOL 202 (H) 08/06/2023   HDL 56.10 08/06/2023   LDLCALC 131 (H) 08/06/2023   TRIG 73.0 08/06/2023   CHOLHDL 4 08/06/2023   Wt Readings from Last 3 Encounters:  09/04/23 234 lb 9.6 oz (106.4 kg)  08/06/23 238 lb 9.6 oz (108.2 kg)  04/21/23 225 lb 6.4 oz (102.2 kg)   Last vitamin D Lab Results  Component Value Date   VD25OH 16.18 (L) 08/06/2023    Notable Signs/Symptoms: Pt states her sediment rate is elevated in a recent lab  Lifestyle & Dietary Hx Pt present today alone. Pt would like to learn more about creating a healthy relationship of food. Pt desires weight reductions. Pt reports she walks the dogs daily  and visiting the gym perfromin cardio and strength training stating 2 weeks ago. Pt reports she has support from her gym community at [employer]. Pt reports she works from home mostly sitting. Pt reports she starting "fasting" two weeks ago for 40 days from sugars and chemicals in her food. Pt reports she is avoiding gluten to determine if her bowels will improve. Pt reports bloating and loose stools. Pt reports she has been trying diets all her life and states the maintenance phase is her biggest challenge. Pt reports she is lactose intolerant and avoid  milk and selects almond milk in replace. Pt states she tolerates small amounts of cheese and yogurt.  Pt reports her appetite is good and her energy levels decrease through the day. All Pt's questions were answered during this encounter.   Estimated daily fluid intake: 64 oz Supplements: none Sleep: good 7-8 hours nightly  Stress / self-care: 5 out of  10 / massage, reading, singing  Current average weekly physical activity: walking daily 20-30 minutes, 2 days weekly 60 minutes   24-Hr Dietary Recall First Meal: oatmeal or bacon and eggs, water Snack: carrots, celery, apples, hummus Second Meal: [place], greens, chips or [place] bacon, eggs, water Snack: mixed nuts Third Meal: GF pasta, broccoli, butternut squash, water Snack: popcorn Beverages: water, unsweetened almond milk   NUTRITION DIAGNOSIS  NB-1.1 Food and nutrition-related knowledge deficit As related to no prior nutrition related education.  As evidenced by Pt's report and dietary recall.   NUTRITION INTERVENTION  Nutrition education (E-1) on the following topics:  Fruits & Vegetables: Aim to fill half your plate with a variety of fruits and vegetables. They are rich in vitamins, minerals, and fiber, and can help reduce the risk of chronic diseases. Choose a colorful assortment of fruits and vegetables to ensure you get a wide range of nutrients. Grains and Starches: Make at least half of your grain choices whole grains, such as brown rice, whole wheat bread, and oats. Whole grains provide fiber, which aids in digestion and healthy cholesterol levels. Aim for whole forms of starchy vegetables such as potatoes, sweet potatoes, beans, peas, and corn, which are fiber rich and provide many vitamins and minerals.  Protein: Incorporate lean sources of protein, such as poultry, fish, beans, nuts, and seeds, into your meals. Protein is essential for building and repairing tissues, staying full, balancing blood sugar, as well as supporting  immune function. Dairy: Include low-fat or fat-free dairy products like milk, yogurt, and cheese in your diet. Dairy foods are excellent sources of calcium and vitamin D, which are crucial for bone health.  Physical Activity: Aim for 60 minutes of physical activity daily. Regular physical activity promotes overall health-including helping to reduce risk for heart disease and diabetes, promoting mental health, and helping [place] sleep better.    Handouts Provided Include  Gluten free grains 9 inch Plate planner Snack List   Learning Style & Readiness for Change Teaching method utilized: Visual & Auditory  Demonstrated degree of understanding via: Teach Back  Barriers to learning/adherence to lifestyle change: none  Goals Established by Pt 1- Aim for half a plate of non starchy veggies on a 9 inch plate   MONITORING & EVALUATION Dietary intake, weekly physical activity  Next Steps  Patient is to return.

## 2023-10-23 ENCOUNTER — Ambulatory Visit: Payer: 59 | Admitting: Family Medicine

## 2023-10-23 ENCOUNTER — Encounter: Payer: Self-pay | Admitting: Family Medicine

## 2023-10-23 VITALS — BP 112/74 | HR 66 | Temp 98.4°F | Ht 63.0 in | Wt 231.4 lb

## 2023-10-23 DIAGNOSIS — M79601 Pain in right arm: Secondary | ICD-10-CM

## 2023-10-23 DIAGNOSIS — L209 Atopic dermatitis, unspecified: Secondary | ICD-10-CM

## 2023-10-23 DIAGNOSIS — Z8619 Personal history of other infectious and parasitic diseases: Secondary | ICD-10-CM

## 2023-10-23 DIAGNOSIS — R195 Other fecal abnormalities: Secondary | ICD-10-CM

## 2023-10-23 DIAGNOSIS — T50905A Adverse effect of unspecified drugs, medicaments and biological substances, initial encounter: Secondary | ICD-10-CM | POA: Diagnosis not present

## 2023-10-23 MED ORDER — TRIAMCINOLONE ACETONIDE 0.1 % EX OINT: 1.0000 | TOPICAL_OINTMENT | Freq: Two times a day (BID) | CUTANEOUS | 0 refills | Status: DC

## 2023-10-23 NOTE — Progress Notes (Signed)
Established Patient Office Visit   Subjective  Patient ID: Renee Day, female    DOB: Mar 20, 1971  Age: 53 y.o. MRN: [id number]  Chief Complaint  Patient presents with   Medication Reaction    Valtrex- patient has been having loose stool for over a month,    Rash    Discoloration on hands   Arm Pain    Right arm pain and swelling, patient heard a pop in the arm, rate of pain 8 out of 10     Renee Day is a 53 year old female who presents with skin discoloration, arm pain, and gastrointestinal issues.  She has significant pain and swelling in her right arm for about three weeks. She recalls feeling a 'snap' in her arm while lying in bed, followed by sharp pain lasting approximately ten minutes. Since then, she has experienced difficulty performing everyday movements and cannot lay on her right side due to pain. Lifting and other activities remain painful.  Patient denies prior injury or increased activities that may have caused symptoms.  She has been experiencing loose stools for over a month, which she attributes to long-term use of Valtrex. She stopped taking Valtrex on February 1st, and briefly experienced formed stools on February 9th, but the loose stools have since returned. The stools are loose but not entirely liquid, with some formed pieces.  Placed on Valtrex since the end of last year due to shingles on upper eyelid by ophthalmology.  She reports skin discoloration on her hands, which she describes as spreading. Her skin was previously dry, and she has been using Vaseline as a moisturizer. The discoloration does not itch, and she has not introduced any new products to her skincare routine. Additionally, she mentions a red rash on her side, which is blotchy and different from the discoloration on her hands, present for at least a month.  Her SED rate was elevated at 28 during a blood test conducted on January 3rd, which was ordered by her optometrist in  preparation for Valtrex use. She associates this with inflammation from a previous shingles outbreak in December.     Patient Active Problem List   Diagnosis Date Noted   Family history of Crohn's disease 08/12/2023   Obesity 08/12/2023   Irregular periods 07/14/2023   Menopausal symptom 07/14/2023   Erythema intertrigo 07/11/2023   Irritant dermatitis 07/10/2023   Vitamin D deficiency 06/15/2021   Vitamin B12 deficiency 06/15/2021   Multinodular goiter 02/23/2021   Achilles tendinitis, right leg 02/19/2021   Subclinical hyperthyroidism 10/02/2020   Asthma 05/11/2018   Goiter 05/11/2018   Uterine leiomyoma 05/11/2018   Past Medical History:  Diagnosis Date   Asthma    Blood in stool    Migraine    Thyroid disease    UTI (urinary tract infection)    Vitamin B12 deficiency    Past Surgical History:  Procedure Laterality Date   COLONOSCOPY     Social History   Tobacco Use   Smoking status: Never   Smokeless tobacco: Never  Vaping Use   Vaping status: Never Used  Substance Use Topics   Alcohol use: Not Currently   Drug use: Never   Family History  Problem Relation Age of Onset   Cancer Mother    Diabetes Mother    Depression Father    Diabetes Father    Arthritis Sister    Depression Sister    Arthritis Maternal Grandmother    Diabetes Maternal Grandmother  Esophageal cancer Maternal Grandfather    COPD Maternal Grandfather    Early death Maternal Grandfather    Diabetes Paternal Grandmother    Depression Paternal Grandmother    Arthritis Paternal Grandmother    Thyroid disease Neg Hx    Colon cancer Neg Hx    Stomach cancer Neg Hx    Neuropathy Neg Hx    Allergies  Allergen Reactions   Latex Itching      ROS Negative unless stated above    Objective:     BP 112/74 (BP Location: Left Arm, Patient Position: Sitting, Cuff Size: Large)   Pulse 66   Temp 98.4 F (36.9 C) (Oral)   Ht 5\' 3"  (1.6 m)   Wt 231 lb 6.4 oz (105 kg)   SpO2 97%    BMI 40.99 kg/m  BP Readings from Last 3 Encounters:  10/23/23 112/74  09/04/23 122/70  08/06/23 110/84   Wt Readings from Last 3 Encounters:  10/23/23 231 lb 6.4 oz (105 kg)  10/01/23 233 lb 3.2 oz (105.8 kg)  09/04/23 234 lb 9.6 oz (106.4 kg)      Physical Exam Constitutional:      General: She is not in acute distress.    Appearance: Normal appearance.  HENT:     Head: Normocephalic and atraumatic.     Nose: Nose normal.     Mouth/Throat:     Mouth: Mucous membranes are moist.  Cardiovascular:     Rate and Rhythm: Normal rate and regular rhythm.     Heart sounds: Normal heart sounds. No murmur heard.    No gallop.  Pulmonary:     Effort: Pulmonary effort is normal. No respiratory distress.     Breath sounds: Normal breath sounds. No wheezing, rhonchi or rales.  Musculoskeletal:       Arms:     Comments: Edema of R distal biceps brachii.  TTP of aponeurosis and pronator teres.  Skin:    General: Skin is warm and dry.  Neurological:     Mental Status: She is alert and oriented to person, place, and time.      No results found for any visits on 10/23/23.    Assessment & Plan:  Atopic dermatitis, unspecified type -     Triamcinolone Acetonide; Apply 1 Application topically 2 (two) times daily.  Dispense: 80 g; Refill: 0  Pain of right upper extremity  History of shingles  Adverse effect of drug, initial encounter  Loose stools   Skin Discoloration and Dryness   Discoloration and dryness on the hands, along with a red rash on the side, suggest possible eczema or a side effect from Valtrex. Discontinue Valtrex. Apply Triamcinolone cream to affected areas and use a good moisturizer, especially during winter, to retain moisture.    Right Arm pain A "snap" sensation in the arm three weeks ago, followed by persistent pain and swelling, indicates a possible biceps vs tendon tear. Pain worsens with movement and lifting. Refer to orthopedics for further evaluation  and management.  Can also go to Ortho walk-in clinic.  Loose Stools   Loose stools have persisted for over a month, possibly due to Valtrex. Discontinue Valtrex and consider a probiotic to help regulate bowel movements.  Follow-up   Re-evaluate skin condition and bowel movements after stopping Valtrex.   No follow-ups on file.  Follow-up as needed  Deeann Saint, MD

## 2023-12-05 ENCOUNTER — Ambulatory Visit: Payer: 59 | Admitting: Dietician

## 2024-01-15 ENCOUNTER — Ambulatory Visit: Admitting: Family Medicine

## 2024-01-15 ENCOUNTER — Encounter: Payer: Self-pay | Admitting: Family Medicine

## 2024-01-15 VITALS — BP 124/84 | HR 84 | Temp 98.3°F | Ht 63.0 in | Wt 238.0 lb

## 2024-01-15 DIAGNOSIS — M7662 Achilles tendinitis, left leg: Secondary | ICD-10-CM | POA: Diagnosis not present

## 2024-01-15 DIAGNOSIS — R600 Localized edema: Secondary | ICD-10-CM | POA: Diagnosis not present

## 2024-01-15 DIAGNOSIS — R0602 Shortness of breath: Secondary | ICD-10-CM

## 2024-01-15 DIAGNOSIS — E0590 Thyrotoxicosis, unspecified without thyrotoxic crisis or storm: Secondary | ICD-10-CM

## 2024-01-15 DIAGNOSIS — R0789 Other chest pain: Secondary | ICD-10-CM

## 2024-01-15 DIAGNOSIS — R21 Rash and other nonspecific skin eruption: Secondary | ICD-10-CM | POA: Diagnosis not present

## 2024-01-15 NOTE — Progress Notes (Signed)
 Established Patient Office Visit   Subjective  Patient ID: Renee Day, female    DOB: October 15, 1970  Age: 53 y.o. MRN: [id number]  Chief Complaint  Patient presents with   Edema    Face leg and arm swelling started 1 week ago, chest sharp pain     Patient is a 53 year old female seen for several acute concerns.  Patient endorses edema of legs left greater than right, right side of face face, RUE x 1 week.  Patient states symptoms started while on a short cruise.  Patient denies eating increased salt or increased activity.  Edema in legs began improving with elevation.  Discoloration noted on skin of right lower leg.  Denies pruritus.  Patient also with tendinitis in left Achilles.  Denies injury or overuse.  Patient had tendinitis of right forearm, seen by Ortho.  Arm remains swollen.  Patient also notes continued pain and muscle tension in upper neck/bilateral shoulders despite massages, acupuncture, and chiropractic care.  Patient travels frequently for work and leisure.  Drove down from [place] this morning.    Patient Active Problem List   Diagnosis Date Noted   Family history of Crohn's disease 08/12/2023   Obesity 08/12/2023   Irregular periods 07/14/2023   Menopausal symptom 07/14/2023   Erythema intertrigo 07/11/2023   Irritant dermatitis 07/10/2023   Vitamin D  deficiency 06/15/2021   Vitamin B12 deficiency 06/15/2021   Multinodular goiter 02/23/2021   Achilles tendinitis, right leg 02/19/2021   Subclinical hyperthyroidism 10/02/2020   Asthma 05/11/2018   Goiter 05/11/2018   Uterine leiomyoma 05/11/2018   Past Medical History:  Diagnosis Date   Asthma    Blood in stool    Migraine    Thyroid  disease    UTI (urinary tract infection)    Vitamin B12 deficiency    Past Surgical History:  Procedure Laterality Date   COLONOSCOPY     Social History   Tobacco Use   Smoking status: Never   Smokeless tobacco: Never  Vaping Use   Vaping status: Never  Used  Substance Use Topics   Alcohol use: Not Currently   Drug use: Never   Family History  Problem Relation Age of Onset   Cancer Mother    Diabetes Mother    Depression Father    Diabetes Father    Arthritis Sister    Depression Sister    Arthritis Maternal Grandmother    Diabetes Maternal Grandmother    Esophageal cancer Maternal Grandfather    COPD Maternal Grandfather    Early death Maternal Grandfather    Diabetes Paternal Grandmother    Depression Paternal Grandmother    Arthritis Paternal Grandmother    Thyroid  disease Neg Hx    Colon cancer Neg Hx    Stomach cancer Neg Hx    Neuropathy Neg Hx    Allergies  Allergen Reactions   Latex Itching    ROS Negative unless stated above    Objective:      BP 124/84 (BP Location: Left Arm, Patient Position: Sitting, Cuff Size: Large)   Pulse 84   Temp 98.3 F (36.8 C) (Oral)   Ht 5\' 3"  (1.6 m)   Wt 238 lb (108 kg)   LMP  (LMP Unknown)   SpO2 97%   BMI 42.16 kg/m  BP Readings from Last 3 Encounters:  01/15/24 124/84  10/23/23 112/74  09/04/23 122/70   Wt Readings from Last 3 Encounters:  01/15/24 238 lb (108 kg)  10/23/23 231 lb 6.4  oz (105 kg)  10/01/23 233 lb 3.2 oz (105.8 kg)      Physical Exam Constitutional:      General: She is not in acute distress.    Appearance: Normal appearance.  HENT:     Head: Normocephalic and atraumatic.     Nose: Nose normal.     Mouth/Throat:     Mouth: Mucous membranes are moist.  Cardiovascular:     Rate and Rhythm: Normal rate and regular rhythm.     Heart sounds: Normal heart sounds. No murmur heard.    No friction rub. No gallop.  Pulmonary:     Effort: Pulmonary effort is normal. No respiratory distress.     Breath sounds: Normal breath sounds. No wheezing, rhonchi or rales.  Musculoskeletal:     Right lower leg: Edema present.     Left lower leg: Edema present.     Comments: Edema of RUE.  Mild edema of right face.  LUE> RUE.  Skin:    General: Skin  is warm and dry.  Neurological:     Mental Status: She is alert and oriented to person, place, and time.        10/23/2023    2:34 PM 10/01/2023    8:10 AM 08/13/2023    9:06 AM  Depression screen PHQ 2/9  Decreased Interest 0 0 0  Down, Depressed, Hopeless 0 0 0  PHQ - 2 Score 0 0 0  Altered sleeping 0  0  Tired, decreased energy 0  0  Change in appetite 0  0  Feeling bad or failure about yourself  0  0  Trouble concentrating 0  0  Moving slowly or fidgety/restless 0  0  Suicidal thoughts 0  0  PHQ-9 Score 0  0  Difficult doing work/chores Not difficult at all  Not difficult at all      10/23/2023    2:34 PM 08/13/2023    9:06 AM 08/06/2023    9:56 AM 04/21/2023    9:42 AM  GAD 7 : Generalized Anxiety Score  Nervous, Anxious, on Edge 0 0 0 0  Control/stop worrying 0 0 0 0  Worry too much - different things 0 0 0 0  Trouble relaxing 0 0 0 0  Restless 0 0 0 0  Easily annoyed or irritable 0 0 2 0  Afraid - awful might happen 0 0 0 0  Total GAD 7 Score 0 0 2 0  Anxiety Difficulty Not difficult at all Not difficult at all       No results found for any visits on 01/15/24.    Assessment & Plan:   Peripheral edema -     C-reactive protein -     CBC with Differential/Platelet -     Brain natriuretic peptide -     Lupus (SLE) Analysis -     TSH -     T4, free -     T3 -     Comprehensive metabolic panel with GFR -     D-dimer, quantitative  Rash -     C-reactive protein -     Lupus (SLE) Analysis  SOB (shortness of breath) -     C-reactive protein -     CBC with Differential/Platelet -     Brain natriuretic peptide -     Lupus (SLE) Analysis -     TSH -     T4, free -     T3 -  D-dimer, quantitative  Achilles tendinitis of left lower extremity -     C-reactive protein -     CBC with Differential/Platelet  Subclinical hyperthyroidism -     TSH -     T4, free -     T3  Other chest pain  Patient was several acute concerns including peripheral  edema, changes in skin, SOB, tendinitis.  Discussed possible causes including autoimmune disorder.  Prior autoimmune screening has been negative.  Given current symptoms recheck.  Discussed obtaining additional labs to evaluate for CHF, worsening thyroid  dysfunction, etc.  Chest pain nonspecific.  Possible causes include anxiety, pericarditis, effusion, musculoskeletal cause, also consider PE due to frequent travel.  No rubs noted on exam.  If labs negative discussed obtaining echo and Holter monitor.  Given strict precautions.  Return if symptoms worsen or fail to improve.   Viola Greulich, MD

## 2024-01-16 ENCOUNTER — Ambulatory Visit: Payer: Self-pay | Admitting: Family Medicine

## 2024-01-16 LAB — COMPREHENSIVE METABOLIC PANEL WITH GFR
ALT: 20 U/L (ref 0–35)
AST: 18 U/L (ref 0–37)
Albumin: 3.7 g/dL (ref 3.5–5.2)
Alkaline Phosphatase: 82 U/L (ref 39–117)
BUN: 12 mg/dL (ref 6–23)
CO2: 28 meq/L (ref 19–32)
Calcium: 9.1 mg/dL (ref 8.4–10.5)
Chloride: 103 meq/L (ref 96–112)
Creatinine, Ser: 0.7 mg/dL (ref 0.40–1.20)
GFR: 100.67 mL/min (ref >60.00)
Glucose, Bld: 89 mg/dL (ref 70–99)
Potassium: 3.8 meq/L (ref 3.5–5.1)
Sodium: 137 meq/L (ref 135–145)
Total Bilirubin: 0.3 mg/dL (ref 0.2–1.2)
Total Protein: 7.3 g/dL (ref 6.0–8.3)

## 2024-01-16 LAB — CBC WITH DIFFERENTIAL/PLATELET
Basophils Absolute: 0.1 10*3/uL (ref 0.0–0.1)
Basophils Relative: 0.9 % (ref 0.0–3.0)
Eosinophils Absolute: 0.1 10*3/uL (ref 0.0–0.7)
Eosinophils Relative: 1.5 % (ref 0.0–5.0)
HCT: 38.4 % (ref 36.0–46.0)
Hemoglobin: 12.4 g/dL (ref 12.0–15.0)
Lymphocytes Relative: 27.2 % (ref 12.0–46.0)
Lymphs Abs: 2.2 10*3/uL (ref 0.7–4.0)
MCHC: 32.3 g/dL (ref 30.0–36.0)
MCV: 85.5 fl (ref 78.0–100.0)
Monocytes Absolute: 0.5 10*3/uL (ref 0.1–1.0)
Monocytes Relative: 5.8 % (ref 3.0–12.0)
Neutro Abs: 5.2 10*3/uL (ref 1.4–7.7)
Neutrophils Relative %: 64.6 % (ref 43.0–77.0)
Platelets: 244 10*3/uL (ref 150.0–400.0)
RBC: 4.5 Mil/uL (ref 3.87–5.11)
RDW: 13 % (ref 11.5–15.5)
WBC: 8.1 10*3/uL (ref 4.0–10.5)

## 2024-01-16 LAB — D-DIMER, QUANTITATIVE: D-Dimer, Quant: 0.39 ug{FEU}/mL (ref <0.50)

## 2024-01-16 LAB — T3: T3, Total: 83 ng/dL (ref 76–181)

## 2024-01-16 LAB — C-REACTIVE PROTEIN: CRP: <1 mg/dL (ref 0.5–20.0)

## 2024-01-19 LAB — BRAIN NATRIURETIC PEPTIDE: Pro B Natriuretic peptide (BNP): 3 pg/mL (ref 0.0–100.0)

## 2024-01-20 LAB — LUPUS (SLE) ANALYSIS
Anti Nuclear Antibody (ANA): NEGATIVE
Anti-striation Abs: NEGATIVE
Complement C4, Serum: 30 mg/dL (ref 12–38)
ENA RNP Ab: 0.4 AI (ref 0.0–0.9)
ENA SM Ab Ser-aCnc: <0.2 AI (ref 0.0–0.9)
ENA SSA (RO) Ab: <0.2 AI (ref 0.0–0.9)
ENA SSB (LA) Ab: <0.2 AI (ref 0.0–0.9)
Mitochondrial Ab: <20 U (ref 0.0–20.0)
Parietal Cell Ab: 1.2 U (ref 0.0–20.0)
Scleroderma (Scl-70) (ENA) Antibody, IgG: <0.2 AI (ref 0.0–0.9)
Smooth Muscle Ab: 23 U — ABNORMAL HIGH (ref 0–19)
Thyroperoxidase Ab SerPl-aCnc: 17 [IU]/mL (ref 0–34)
dsDNA Ab: <1 [IU]/mL (ref 0–9)

## 2024-01-20 LAB — T4, FREE: Free T4: 0.89 ng/dL (ref 0.60–1.60)

## 2024-01-20 LAB — TSH: TSH: 0.32 u[IU]/mL — ABNORMAL LOW (ref 0.35–5.50)

## 2024-02-17 ENCOUNTER — Other Ambulatory Visit: Payer: Self-pay | Admitting: Sports Medicine

## 2024-02-17 ENCOUNTER — Ambulatory Visit
Admission: RE | Admit: 2024-02-17 | Discharge: 2024-02-17 | Disposition: A | Discharge status: Home / Self Care | Source: Ambulatory Visit | Attending: Sports Medicine | Admitting: Sports Medicine

## 2024-02-17 DIAGNOSIS — M7662 Achilles tendinitis, left leg: Secondary | ICD-10-CM

## 2024-02-17 DIAGNOSIS — G8929 Other chronic pain: Secondary | ICD-10-CM

## 2024-03-08 ENCOUNTER — Ambulatory Visit: Payer: 59 | Admitting: Internal Medicine

## 2024-03-19 ENCOUNTER — Ambulatory Visit: Admitting: Internal Medicine

## 2024-03-30 ENCOUNTER — Other Ambulatory Visit: Payer: Self-pay | Admitting: Sports Medicine

## 2024-03-30 DIAGNOSIS — M25472 Effusion, left ankle: Secondary | ICD-10-CM

## 2024-03-30 DIAGNOSIS — M79672 Pain in left foot: Secondary | ICD-10-CM

## 2024-03-30 DIAGNOSIS — M7662 Achilles tendinitis, left leg: Secondary | ICD-10-CM

## 2024-04-06 ENCOUNTER — Other Ambulatory Visit

## 2024-04-07 ENCOUNTER — Inpatient Hospital Stay
Admission: RE | Admit: 2024-04-07 | Discharge: 2024-04-07 | Discharge status: Home / Self Care | Source: Ambulatory Visit | Attending: Sports Medicine | Admitting: Sports Medicine

## 2024-04-07 DIAGNOSIS — M25472 Effusion, left ankle: Secondary | ICD-10-CM

## 2024-04-07 DIAGNOSIS — M7662 Achilles tendinitis, left leg: Secondary | ICD-10-CM

## 2024-04-07 DIAGNOSIS — M79672 Pain in left foot: Secondary | ICD-10-CM

## 2024-04-12 ENCOUNTER — Other Ambulatory Visit

## 2024-04-22 ENCOUNTER — Ambulatory Visit: Admitting: Internal Medicine

## 2024-04-22 ENCOUNTER — Encounter: Payer: Self-pay | Admitting: Internal Medicine

## 2024-04-22 ENCOUNTER — Ambulatory Visit (INDEPENDENT_AMBULATORY_CARE_PROVIDER_SITE_OTHER): Admitting: Internal Medicine

## 2024-04-22 VITALS — BP 120/70 | HR 72 | Ht 63.0 in | Wt 228.0 lb

## 2024-04-22 DIAGNOSIS — E0590 Thyrotoxicosis, unspecified without thyrotoxic crisis or storm: Secondary | ICD-10-CM | POA: Diagnosis not present

## 2024-04-22 DIAGNOSIS — E042 Nontoxic multinodular goiter: Secondary | ICD-10-CM

## 2024-04-22 LAB — T4, FREE: Free T4: 1.3 ng/dL (ref 0.8–1.8)

## 2024-04-22 LAB — TSH: TSH: 0.24 m[IU]/L — ABNORMAL LOW

## 2024-04-22 LAB — T3, FREE: T3, Free: 3.1 pg/mL (ref 2.3–4.2)

## 2024-04-22 NOTE — Patient Instructions (Signed)
Please stop at the lab.  Please return in 1 year.  

## 2024-04-22 NOTE — Progress Notes (Signed)
 Patient ID: Renee Day, female   DOB: 02-06-1971, 53 y.o.   MRN: [id number]  HPI  Renee Day is a 53 y.o.-year-old female, returning for follow-up for multinodular goiter and history of thyrotoxicosis.  She previously saw Dr. Kassie, but last visit with me 7.5 months ago.  Interim history: At last visit, she returned after feeling that her right thyroid  nodule had increased in size.  Her PCP referred her to Dr. Eletha and she ended up seeing him but not proceeding with surgery. She has a tear in her Achilles tendon - sees a podiatrist. She is in a boot. She had a steroid inj 01/16/2024. She started to see a functional medicine provider recently.  She had labs in June showing a slightly low TSH.  Multinodular goiter: - dx 2009  Thyroid  U/S results and records are benign thyroid  biopsy from 2009 are not available.  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   The remaining right thyroid  gland is diffusely enlarged and lobular with multiple areas of pseudo nodularity. No additional discrete nodules are identified separate from the background thyroid  parenchyma. No suspicious features.   IMPRESSION: Diffusely heterogeneous, lobular and asymmetrically enlarged (right larger than left) thyroid  gland.     Large 3.6 cm spongiform nodule in the right upper gland was reportedly previously biopsied.  Recommend correlation with prior biopsy results. From a morphologic perspective, a spongiform configuration is typically considered benign/low risk.   No additional discrete nodules or suspicious features identified.  Thyroid  U/S (09/05/2023): Parenchymal Echotexture: Mildly heterogenous Isthmus: Normal in size measuring 0.6 cm in diameter, unchanged Right lobe: Enlarged measuring 10.5 x 6.0 x 5.9 cm, previously, 9.4 x 5.8 x 4.4 cm Left lobe: Borderline enlarged measuring 6.1 x 1.9 x 1.3 cm, previously, 6.1 x 2.1 x 1.5 cm _________________________________________________________   Estimated total number of nodules >/= 1 cm: 1 _________________________________________________________   The 3.1 x 3.0 x 2.0 cm spongiform/benign-appearing nodule within the superior pole of the right lobe of the thyroid  (labeled 1), is unchanged to decreased in size compared to the 01/2021 examination, previously, 3.6 x 3.4 x 2.0 cm. By report, this nodule was biopsied at an outside institution. _________________________________________________________   There is a 0.8 cm isoechoic nodule within the mid aspect of the left lobe of the thyroid  (labeled 2), not definitively seen on the 2022 examination, though does not meet criteria to recommend percutaneous sampling or continued dedicated follow-up.   There is a punctate (0.9 cm) benign colloid containing cyst within the inferior pole of the left lobe of the thyroid  (labeled 3), which does not meet imaging criteria to recommend percutaneous sampling or continued dedicated follow-up.   IMPRESSION: 1. Similar findings of thyromegaly and multinodular goiter. No worrisome new or enlarging thyroid  nodules. 2. Dominant 3.1 cm spongiform/benign-appearing nodule within the superior pole of the right lobe of the thyroid  is unchanged to decreased in size compared to the 01/2021 examination, previously, 3.6 cm. By report, this  nodule was biopsied at an  outside institution. If so, correlation with previous biopsy results is advised. Regardless, this nodule does not meet criteria to recommend percutaneous sampling or continued dedicated follow-up. 3. None of the additional discretely measured thyroid  nodules/cysts meet imaging criteria to recommend percutaneous sampling or continued dedicated follow-up.  Pt denies: - hoarseness - dysphagia - choking  But she does feel that her nodule is larger and causing some pressure.  1.3 Thyrotoxicosis: - dx 2021  I reviewed pt's thyroid  tests: 02/23/2024: TSH 0.146 (0.45-4.5), Ft4, TRAb, TPO, ATA Abs normal ([facility]) Lab Results  Component Value Date   TSH 0.32 (L) 01/15/2024   TSH 0.22 (L) 09/04/2023   TSH 0.29 (L) 08/06/2023   TSH 0.53 08/19/2022   TSH 0.51 06/19/2022   TSH 0.43 12/05/2021   TSH 0.18 (L) 06/15/2021   TSH 0.53 01/19/2021   TSH 0.44 10/02/2020   TSH 0.76 06/14/2020   FREET4 0.89 01/15/2024   FREET4 1.1 09/04/2023   FREET4 0.98 08/06/2023   FREET4 0.93 06/19/2022   FREET4 1.01 12/05/2021   FREET4 0.91 06/15/2021   FREET4 0.78 10/02/2020   FREET4 1.1 06/14/2020   FREET4 0.87 06/10/2019    Lab Results  Component Value Date   T3FREE 3.2 09/04/2023   T3FREE 3.0 06/19/2022   .TSIs: Lab Results  Component Value Date   TSI <89 09/04/2023   She has chronic: - heat intolerance - palpitations - but improved  No: - weight loss - insomnia - tremors - diarrhea  + FH of thyroid  ds. : cousin with goiter. No FH of thyroid  cancer. No h/o radiation tx to head or neck. No recent contrast studies. No steroid use. No herbal supplements. No Biotin supplements or Hair, Skin and Nails vitamins.  Pt also has a history of type 2 diabetes per review of the chart, however, latest HbA1c levels reviewed: Lab Results  Component Value Date   HGBA1C 5.7 08/06/2023   HGBA1C 5.6 06/19/2022   HGBA1C 5.5 06/15/2021   HGBA1C 5.4 02/23/2021   HGBA1C 5.4 06/14/2020   HGBA1C 5.5  06/10/2019   She also has a history of vitamin D  and B12 deficiency.  ROS:  + See HPI   Past Medical History:  Diagnosis Date   Asthma    Blood in stool    Migraine    Thyroid  disease    UTI (urinary tract infection)    Vitamin B12 deficiency    Past Surgical History:  Procedure Laterality Date   COLONOSCOPY     Social History   Socioeconomic History   Marital status: Married    Spouse name: Not on file   Number of children: 0   Years of education: Not on file   Highest education level: Master's degree (e.g., MA, MS, MEng, MEd, MSW, MBA)  Occupational History   Not on file  Tobacco Use   Smoking status: Never   Smokeless tobacco: Never  Vaping Use   Vaping status: Never Used  Substance and Sexual Activity   Alcohol use: Not Currently   Drug use: Never   Sexual activity: Yes  Other Topics Concern   Not on file  Social History Narrative   Not on file   Social Drivers of Health   Financial Resource Strain: Low Risk  (08/05/2023)   Overall Financial Resource Strain (CARDIA)    Difficulty of Paying Living Expenses: Not hard at all  Food Insecurity: No Food Insecurity (10/01/2023)   Hunger Vital Sign    Worried  About Running Out of Food in the Last Year: Never true    Ran Out of Food in the Last Year: Never true  Transportation Needs: No Transportation Needs (08/05/2023)   PRAPARE - Administrator, Civil Service (Medical): No    Lack of Transportation (Non-Medical): No  Physical Activity: Unknown (08/05/2023)   Exercise Vital Sign    Days of Exercise per Week: 0 days    Minutes of Exercise per Session: Not on file  Stress: No Stress Concern Present (08/05/2023)   [employer] of Occupational Health - Occupational Stress Questionnaire    Feeling of Stress : Only a little  Social Connections: Socially Integrated (08/05/2023)   Social Connection and Isolation Panel    Frequency of Communication with Friends and Family: More than three times a week     Frequency of Social Gatherings with Friends and Family: Once a week    Attends Religious Services: More than 4 times per year    Active Member of [employer] or Organizations: Yes    Attends Engineer, structural: More than 4 times per year    Marital Status: Married  Catering manager Violence: Not on file   Current Outpatient Medications on File Prior to Visit  Medication Sig Dispense Refill   albuterol  (VENTOLIN  HFA) 108 (90 Base) MCG/ACT inhaler Inhale 2 puffs into the lungs every 6 (six) hours as needed for wheezing or shortness of breath. 8 g 5   atovaquone -proguanil (MALARONE ) 250-100 MG TABS tablet PLEASE SEE ATTACHED FOR DETAILED DIRECTIONS (Patient not taking: Reported on 09/04/2023)     Cholecalciferol (D3-50) 1.25 MG (50000 UT) capsule Take 50,000 Units by mouth daily.     cyanocobalamin  1000 MCG tablet Take 1,000 mcg by mouth daily. (Patient not taking: Reported on 09/04/2023)     cyclobenzaprine  (FLEXERIL ) 5 MG tablet Take up to 3 times a day as needed for symptoms related to sciatica. (Patient not taking: Reported on 09/04/2023) 30 tablet 1   fluticasone  (FLONASE ) 50 MCG/ACT nasal spray Place 1 spray into both nostrils daily. (Patient taking differently: Place 1 spray into both nostrils daily.) 16 g 6   gabapentin  (NEURONTIN ) 100 MG capsule Take 1 capsule (100 mg total) by mouth 3 (three) times daily as needed (burning senation, pain of rash). (Patient not taking: Reported on 09/04/2023) 60 capsule 0   meclizine  (ANTIVERT ) 50 MG tablet Take 0.5-1 tablets (25-50 mg total) by mouth 2 (two) times daily as needed for dizziness. May cause drowsiness. (Patient not taking: Reported on 09/04/2023) 30 tablet 0   Multiple Vitamins-Minerals (VITAMIN D3 COMPLETE PO) Take 1 capsule by mouth every 7 (seven) days. (Patient not taking: Reported on 10/01/2023)     prednisoLONE acetate (PRED FORTE) 1 % ophthalmic suspension Place into the left eye.     scopolamine  (TRANSDERM-SCOP) 1 MG/3DAYS PLACE 1 PATCH  ONTO THE SKIN EVERY 3 DAYS. (Patient not taking: Reported on 09/04/2023)     triamcinolone  ointment (KENALOG ) 0.1 % Apply 1 Application topically 2 (two) times daily. 80 g 0   valACYclovir  (VALTREX ) 1000 MG tablet Take 1,000 mg by mouth daily.     Vitamin D , Ergocalciferol , (DRISDOL ) 1.25 MG (50000 UNIT) CAPS capsule Take 1 capsule (50,000 Units total) by mouth every 7 (seven) days. 12 capsule 0   No current facility-administered medications on file prior to visit.   Allergies  Allergen Reactions   Latex Itching   Family History  Problem Relation Age of Onset   Cancer Mother  Diabetes Mother    Depression Father    Diabetes Father    Arthritis Sister    Depression Sister    Arthritis Maternal Grandmother    Diabetes Maternal Grandmother    Esophageal cancer Maternal Grandfather    COPD Maternal Grandfather    Early death Maternal Grandfather    Diabetes Paternal Grandmother    Depression Paternal Grandmother    Arthritis Paternal Grandmother    Thyroid  disease Neg Hx    Colon cancer Neg Hx    Stomach cancer Neg Hx    Neuropathy Neg Hx    PE: There were no vitals taken for this visit. Wt Readings from Last 3 Encounters:  01/15/24 238 lb (108 kg)  10/23/23 231 lb 6.4 oz (105 kg)  10/01/23 233 lb 3.2 oz (105.8 kg)   Constitutional: overweight, in NAD Eyes:  EOMI, no exophthalmos ENT: + Significant right thyromegaly, no cervical lymphadenopathy Cardiovascular: RRR, No MRG Respiratory: CTA B Musculoskeletal: no deformities, + boot Skin:no rashes Neurological: no tremor with outstretched hands  ASSESSMENT: 1.  Multinodular goiter  2.  History of thyrotoxicosis  PLAN: 1. Thyroid  nodules -Patient with a history of multinodular goiter, with the dominant right thyroid  nodule being large but otherwise without worrisome features: The nodule did not have hypoechogenicity, microcalcifications, internal blood flow, taller than wide distribution or irregular contours.   Moreover, it appears to be spongiform, which can present very low risk of cancer and is essentially considered a benign lesion.  Also, the nodule has been biopsied in the past with benign results.  On the ultrasound from 2022, the nodule appeared to measure 3.4 x 2 x 3.6 cm.  She also had other, smaller, thyroid  nodules previously, but on the latest ultrasound, these appeared more like areas of pseudonodularity, inflammatory nodules, which are benign.  We checked another thyroid  ultrasound (09/05/2023).  The dominant nodule appears to be slightly smaller.  Other nodules did not meet criteria for intervention or follow-up. -Patient does not have a thyroid  cancer family history or personal history of radiation therapy to head or neck to increase her own risk of thyroid  cancer - For last visit, she felt that her thyroid  nodule increased in size and was causing some neck pressure.  She did have significant right thyromegaly.  PCP referred her to Dr. Eletha.  She saw him in 09/2023.  Benefits and possible side effects of surgery were discussed and patient elected not to go through with the surgery at that time especially as we checked a thyroid  ultrasound after our last visit and the right thyroid  nodule appeared to have actually decreased in size.  -At today's visit, she still has some pressure in her neck, but not very bothersome -I will see her back in 6 months  2.  History of thyrotoxicosis - She had a low TSH in 2022, and then again in 08/2023 -At last visit, we repeated her TFTs and a TSH was still slightly low, at 0.22, with normal free thyroid  hormones.  We decided not to intervene at that time especially as the TSI antibody level was undetectable.  Recheck TSH was only slightly low in 01/2024, at 0.32.  Free thyroid  hormones were still normal.  At today's visit she mentions that she had another set of labs obtained by functional medicine.  I reviewed these results and they show a TSH lower than before, at  0.146 and a normal free T4.  She had a slightly high reverse T3 and normal TRAb, TPO, and  AT antibodies. - We previously did discuss about the possibility of using methimazole, RAI treatment, or surgery.   - At today's visit, we will recheck her TFTs.  If they are abnormal, we discussed about the possibility of using methimazole low-dose.  Discussed about benefits and possible side effects including low white blood cell count and elevated liver enzymes - If we do end up sending her to surgery for the right thyroid  nodule, this may help with thyrotoxicosis.  However, we may need a thyroid  uptake and scan first to see if the increased uptake localizes to the right lobe.  Orders Placed This Encounter  Procedures   TSH   T3, free   T4, free   Lela Fendt, MD PhD [facility]

## 2024-04-23 ENCOUNTER — Ambulatory Visit: Payer: Self-pay | Admitting: Internal Medicine

## 2024-04-23 NOTE — Addendum Note (Signed)
 Addended by: TRIXIE FILE on: 04/23/2024 11:28 AM   Modules accepted: Orders

## 2024-06-22 ENCOUNTER — Encounter: Payer: Self-pay | Admitting: Family Medicine

## 2024-06-23 DIAGNOSIS — Z0279 Encounter for issue of other medical certificate: Secondary | ICD-10-CM

## 2024-07-16 ENCOUNTER — Ambulatory Visit: Admitting: Family Medicine

## 2024-07-16 ENCOUNTER — Encounter: Payer: Self-pay | Admitting: Family Medicine

## 2024-07-16 VITALS — BP 126/80 | HR 75 | Temp 98.4°F | Ht 63.0 in | Wt 231.8 lb

## 2024-07-16 DIAGNOSIS — B029 Zoster without complications: Secondary | ICD-10-CM

## 2024-07-16 NOTE — Progress Notes (Signed)
 Established Patient Office Visit   Subjective  Patient ID: Renee Day, female    DOB: December 12, 1970  Age: 53 y.o. MRN: [id number]  Chief Complaint  Patient presents with   Acute Visit    Patient came in today for a rash on the left Hip started 5 days ago, itchy patient denies any pai     Pt is a 53 yo female seen for acute concern.  Pt with rash on L hip noticed Sunday.  Area is pruritic with blitsters that have dried up.  Pt just got back from [place] or work.  She is having to travel most wknds to [place] and other places for work.   Patient Active Problem List   Diagnosis Date Noted   Family history of Crohn's disease 08/12/2023   Obesity 08/12/2023   Irregular periods 07/14/2023   Menopausal symptom 07/14/2023   Erythema intertrigo 07/11/2023   Irritant dermatitis 07/10/2023   Vitamin D  deficiency 06/15/2021   Vitamin B12 deficiency 06/15/2021   Multinodular goiter 02/23/2021   Achilles tendinitis, right leg 02/19/2021   Subclinical hyperthyroidism 10/02/2020   Asthma 05/11/2018   Goiter 05/11/2018   Uterine leiomyoma 05/11/2018   Past Medical History:  Diagnosis Date   Allergy    Asthma    Blood in stool    Migraine    Thyroid  disease    UTI (urinary tract infection)    Vitamin B12 deficiency    Past Surgical History:  Procedure Laterality Date   COLONOSCOPY     Social History   Tobacco Use   Smoking status: Never   Smokeless tobacco: Never  Vaping Use   Vaping status: Never Used  Substance Use Topics   Alcohol use: Not Currently   Drug use: Never   Family History  Problem Relation Age of Onset   Cancer Mother    Diabetes Mother    Early death Mother    Varicose Veins Mother    Depression Father    Diabetes Father    Arthritis Father    Heart disease Father    Arthritis Sister    Depression Sister    Arthritis Maternal Grandmother    Diabetes Maternal Grandmother    Esophageal cancer Maternal Grandfather    COPD Maternal Grandfather     Early death Maternal Grandfather    Diabetes Paternal Grandmother    Depression Paternal Grandmother    Arthritis Paternal Grandmother    Early death Paternal Grandfather    Heart disease Maternal Aunt    Varicose Veins Maternal Aunt    Heart disease Maternal Uncle    Thyroid  disease Neg Hx    Colon cancer Neg Hx    Stomach cancer Neg Hx    Neuropathy Neg Hx    Allergies  Allergen Reactions   Latex Itching    ROS Negative unless stated above    Objective:     BP 126/80 (BP Location: Right Arm, Patient Position: Sitting, Cuff Size: Large)   Pulse 75   Temp 98.4 F (36.9 C) (Oral)   Ht 5' 3 (1.6 m)   Wt 231 lb 12.8 oz (105.1 kg)   SpO2 99%   BMI 41.06 kg/m  BP Readings from Last 3 Encounters:  07/16/24 126/80  04/22/24 120/70  01/15/24 124/84   Wt Readings from Last 3 Encounters:  07/16/24 231 lb 12.8 oz (105.1 kg)  04/22/24 228 lb (103.4 kg)  01/15/24 238 lb (108 kg)      Physical Exam Constitutional:  General: She is not in acute distress.    Appearance: Normal appearance.  HENT:     Head: Normocephalic and atraumatic.     Nose: Nose normal.     Mouth/Throat:     Mouth: Mucous membranes are moist.  Cardiovascular:     Rate and Rhythm: Normal rate and regular rhythm.     Heart sounds: Normal heart sounds. No murmur heard.    No gallop.  Pulmonary:     Effort: Pulmonary effort is normal. No respiratory distress.     Breath sounds: Normal breath sounds. No wheezing, rhonchi or rales.  Skin:    General: Skin is warm and dry.         Comments: Dried hyperpigmented areas on left hip in a linear distribution.  Neurological:     Mental Status: She is alert and oriented to person, place, and time.        10/23/2023    2:34 PM 10/01/2023    8:10 AM 08/13/2023    9:06 AM  Depression screen PHQ 2/9  Decreased Interest 0 0 0  Down, Depressed, Hopeless 0 0 0  PHQ - 2 Score 0 0 0  Altered sleeping 0  0  Tired, decreased energy 0  0  Change in  appetite 0  0  Feeling bad or failure about yourself  0  0  Trouble concentrating 0  0  Moving slowly or fidgety/restless 0  0  Suicidal thoughts 0  0  PHQ-9 Score 0   0   Difficult doing work/chores Not difficult at all  Not difficult at all     Data saved with a previous flowsheet row definition      10/23/2023    2:34 PM 08/13/2023    9:06 AM 08/06/2023    9:56 AM 04/21/2023    9:42 AM  GAD 7 : Generalized Anxiety Score  Nervous, Anxious, on Edge 0 0 0 0  Control/stop worrying 0 0 0 0  Worry too much - different things 0 0 0 0  Trouble relaxing 0 0 0 0  Restless 0 0 0 0  Easily annoyed or irritable 0 0 2 0  Afraid - awful might happen 0 0 0 0  Total GAD 7 Score 0 0 2 0  Anxiety Difficulty Not difficult at all Not difficult at all       No results found for any visits on 07/16/24.    Assessment & Plan:   Herpes zoster without complication  Resolving.  Currently no new lesions appreciated.  As lesions are drying and patient is without pain gabapentin  not indicated.  Given timing antiviral also not indicated at this time.  Patient encouraged to decrease stress and other supportive care to prevent recurrent outbreak.  Shingles vaccine encouraged.  Given note for work.  Return if symptoms worsen or fail to improve.   Clotilda JONELLE Single, MD

## 2024-08-06 ENCOUNTER — Encounter: Admitting: Family Medicine

## 2024-08-16 ENCOUNTER — Ambulatory Visit: Admitting: Family Medicine

## 2024-08-16 ENCOUNTER — Encounter: Payer: Self-pay | Admitting: Family Medicine

## 2024-08-16 VITALS — BP 110/78 | HR 78 | Temp 98.1°F | Ht 63.0 in | Wt 229.4 lb

## 2024-08-16 DIAGNOSIS — I781 Nevus, non-neoplastic: Secondary | ICD-10-CM

## 2024-08-16 DIAGNOSIS — Z0279 Encounter for issue of other medical certificate: Secondary | ICD-10-CM

## 2024-08-16 DIAGNOSIS — Z029 Encounter for administrative examinations, unspecified: Secondary | ICD-10-CM | POA: Diagnosis not present

## 2024-08-16 DIAGNOSIS — R600 Localized edema: Secondary | ICD-10-CM

## 2024-08-16 DIAGNOSIS — R0602 Shortness of breath: Secondary | ICD-10-CM | POA: Diagnosis not present

## 2024-08-16 DIAGNOSIS — R195 Other fecal abnormalities: Secondary | ICD-10-CM

## 2024-08-16 DIAGNOSIS — R202 Paresthesia of skin: Secondary | ICD-10-CM | POA: Diagnosis not present

## 2024-08-16 DIAGNOSIS — Z8619 Personal history of other infectious and parasitic diseases: Secondary | ICD-10-CM | POA: Insufficient documentation

## 2024-08-16 LAB — COMPREHENSIVE METABOLIC PANEL WITH GFR
ALT: 16 U/L (ref 0–35)
AST: 17 U/L (ref 0–37)
Albumin: 3.8 g/dL (ref 3.5–5.2)
Alkaline Phosphatase: 69 U/L (ref 39–117)
BUN: 12 mg/dL (ref 6–23)
CO2: 29 meq/L (ref 19–32)
Calcium: 9.2 mg/dL (ref 8.4–10.5)
Chloride: 103 meq/L (ref 96–112)
Creatinine, Ser: 0.71 mg/dL (ref 0.40–1.20)
GFR: 98.57 mL/min (ref >60.00)
Glucose, Bld: 85 mg/dL (ref 70–99)
Potassium: 3.8 meq/L (ref 3.5–5.1)
Sodium: 136 meq/L (ref 135–145)
Total Bilirubin: 0.5 mg/dL (ref 0.2–1.2)
Total Protein: 7.6 g/dL (ref 6.0–8.3)

## 2024-08-16 LAB — FOLATE: Folate: 17 ng/mL (ref >5.9)

## 2024-08-16 LAB — VITAMIN B12: Vitamin B-12: 372 pg/mL (ref 211–911)

## 2024-08-16 LAB — BRAIN NATRIURETIC PEPTIDE: Pro B Natriuretic peptide (BNP): 22 pg/mL (ref 0.0–100.0)

## 2024-08-16 NOTE — Progress Notes (Signed)
 Established Patient Office Visit   Subjective  Patient ID: Renee Day, female    DOB: 22-May-1971  Age: 53 y.o. MRN: [id number]  Chief Complaint  Patient presents with   Acute Visit    Patient is here because she needs ADA forms completed, stool is not solid but is formed now, also concerned about fluid retention.    Pt is a 53 yo female seen for f/u. Pt has an ADA request form for medical accommodations from work.  At last [facility] patient given a note for adjusting a reduction in travel requirement as the increased stress has caused recurrent shingles.  Patient currently traveling to [place] frequently for work.  Hoping to work one week in [place] followed by three weeks remotely.  BMs formed but not solid.  Prior h/o loose stools last year thought caused by Valtrex .  Saw improvement in stool consistency by February. She is not currently taking magnesium, which she previously used to help with an Achilles issue, and stopped around September.  Endorses fluid retention and LE edema.  Drinking a half gallon water/day.  LE edema does not resolve immediately after elevating her feet, though it helps. Wears compression socks during travel > 2hrs, but can be tight 2/2 edema.  Continues to have issues with L Achilles tendon despite using a walking boot x 6 mo and insoles. Having tingling in her feet and hands, particularly when sitting, and has noted spider veins and edema around her knees.  SOB when walking or climbing stairs.  Unsure if related to decrease activity 2/2 Achilles tendon recovery. No dizziness.  She mentions a recent irregularity in her menstrual cycle, having missed three months before it resumed. No recent problems with anemia.    Patient Active Problem List   Diagnosis Date Noted   Family history of Crohn's disease 08/12/2023   Obesity 08/12/2023   Irregular periods 07/14/2023   Menopausal symptom 07/14/2023   Erythema intertrigo 07/11/2023   Irritant dermatitis  07/10/2023   Vitamin D  deficiency 06/15/2021   Vitamin B12 deficiency 06/15/2021   Multinodular goiter 02/23/2021   Achilles tendinitis, right leg 02/19/2021   Subclinical hyperthyroidism 10/02/2020   Asthma 05/11/2018   Goiter 05/11/2018   Uterine leiomyoma 05/11/2018   Past Medical History:  Diagnosis Date   Allergy    Asthma    Blood in stool    Migraine    Thyroid  disease    UTI (urinary tract infection)    Vitamin B12 deficiency    Past Surgical History:  Procedure Laterality Date   COLONOSCOPY     Social History[1] Family History  Problem Relation Age of Onset   Cancer Mother    Diabetes Mother    Early death Mother    Varicose Veins Mother    Depression Father    Diabetes Father    Arthritis Father    Heart disease Father    Arthritis Sister    Depression Sister    Arthritis Maternal Grandmother    Diabetes Maternal Grandmother    Esophageal cancer Maternal Grandfather    COPD Maternal Grandfather    Early death Maternal Grandfather    Diabetes Paternal Grandmother    Depression Paternal Grandmother    Arthritis Paternal Grandmother    Early death Paternal Grandfather    Heart disease Maternal Aunt    Varicose Veins Maternal Aunt    Heart disease Maternal Uncle    Thyroid  disease Neg Hx    Colon cancer Neg Hx  Stomach cancer Neg Hx    Neuropathy Neg Hx    Allergies[2]  ROS Negative unless stated above    Objective:     BP 110/78 (BP Location: Left Arm, Patient Position: Sitting, Cuff Size: Large)   Pulse 78   Temp 98.1 F (36.7 C) (Oral)   Ht 5' 3 (1.6 m)   Wt 229 lb 6.4 oz (104.1 kg)   SpO2 99%   BMI 40.64 kg/m  BP Readings from Last 3 Encounters:  08/16/24 110/78  07/16/24 126/80  04/22/24 120/70   Wt Readings from Last 3 Encounters:  08/16/24 229 lb 6.4 oz (104.1 kg)  07/16/24 231 lb 12.8 oz (105.1 kg)  04/22/24 228 lb (103.4 kg)      Physical Exam Constitutional:      General: She is not in acute distress.     Appearance: Normal appearance.  HENT:     Head: Normocephalic and atraumatic.     Nose: Nose normal.     Mouth/Throat:     Mouth: Mucous membranes are moist.  Cardiovascular:     Rate and Rhythm: Normal rate and regular rhythm.     Heart sounds: Normal heart sounds. No murmur heard.    No gallop.     Comments: Trace edema of b/l LEs L>R. Pulmonary:     Effort: Pulmonary effort is normal. No respiratory distress.     Breath sounds: Normal breath sounds. No wheezing, rhonchi or rales.  Skin:    General: Skin is warm and dry.  Neurological:     Mental Status: She is alert and oriented to person, place, and time.        08/16/2024    2:22 PM 07/16/2024    3:06 PM 10/23/2023    2:34 PM  Depression screen PHQ 2/9  Decreased Interest 0 0 0  Down, Depressed, Hopeless 0 0 0  PHQ - 2 Score 0 0 0  Altered sleeping 0 0 0  Tired, decreased energy 2 0 0  Change in appetite 1 0 0  Feeling bad or failure about yourself  0 0 0  Trouble concentrating 0 0 0  Moving slowly or fidgety/restless 0 0 0  Suicidal thoughts 0 0 0  PHQ-9 Score 3 0 0   Difficult doing work/chores Not difficult at all Not difficult at all Not difficult at all     Data saved with a previous flowsheet row definition      08/16/2024    2:22 PM 07/16/2024    3:06 PM 10/23/2023    2:34 PM 08/13/2023    9:06 AM  GAD 7 : Generalized Anxiety Score  Nervous, Anxious, on Edge 0 0 0 0  Control/stop worrying 0 0 0 0  Worry too much - different things 0 0 0 0  Trouble relaxing 0 0 0 0  Restless 0 0 0 0  Easily annoyed or irritable 0 0 0 0  Afraid - awful might happen 0 0 0 0  Total GAD 7 Score 0 0 0 0  Anxiety Difficulty  Not difficult at all Not difficult at all Not difficult at all     Results for orders placed or performed in visit on 08/16/24  CMP  Result Value Ref Range   Sodium 136 135 - 145 mEq/L   Potassium 3.8 3.5 - 5.1 mEq/L   Chloride 103 96 - 112 mEq/L   CO2 29 19 - 32 mEq/L   Glucose, Bld 85 70 -  99 mg/dL  BUN 12 6 - 23 mg/dL   Creatinine, Ser 9.28 0.40 - 1.20 mg/dL   Total Bilirubin 0.5 0.2 - 1.2 mg/dL   Alkaline Phosphatase 69 39 - 117 U/L   AST 17 0 - 37 U/L   ALT 16 0 - 35 U/L   Total Protein 7.6 6.0 - 8.3 g/dL   Albumin 3.8 3.5 - 5.2 g/dL   GFR 01.42 >39.99 mL/min   Calcium  9.2 8.4 - 10.5 mg/dL  Vitamin A87  Result Value Ref Range   Vitamin B-12 372 211 - 911 pg/mL  Folate  Result Value Ref Range   Folate 17.0 >5.9 ng/mL  Brain Natriuretic Peptide  Result Value Ref Range   Pro B Natriuretic peptide (BNP) 22.0 0.0 - 100.0 pg/mL      Assessment & Plan:   Bilateral lower extremity edema -     Brain natriuretic peptide; Future -     Folate; Future -     Vitamin B12; Future -     Comprehensive metabolic panel with GFR; Future -     D-dimer, quantitative; Future  Spider veins  Loose stools -     Comprehensive metabolic panel with GFR; Future  Paresthesias -     Folate; Future -     Vitamin B12; Future  SOB (shortness of breath)  Administrative encounter  History of shingles   Chronic edema and venous insufficiency are worsened by prolonged sitting and travel. Heart failure is considered but heart sounds are normal. SOB likely d/t deconditioning s/p L Achilles injury. Continue using compression socks during travel and prolonged sitting, and elevate legs when sitting.  BNP is ordered to rule out heart failure.  Consider referral to vascular.  Currently spider veins are nonpainful.  Discussed as needed Lasix or HCTZ, however would need to use with caution given low normal BP.  Further recommendations based on results.  Chronic loose stools may be influenced by dietary factors. Consider probiotic supplementation.  Consider stool studies.  Discussed referral to GI for worsening symptoms.  Per patient recent thyroid  testing normal.  Intermittent tingling in feet and hands may be related to prolonged sitting or swelling. Vitamin B12 deficiency is considered. A  vitamin B12, folate level is ordered.  ADA medical accommodation form completed as requested by patient's HR. Increased stress due to frequent travel causing recurrent shingles outbreaks.  Return if symptoms worsen or fail to improve.   Clotilda JONELLE Single, MD      [1]  Social History Tobacco Use   Smoking status: Never   Smokeless tobacco: Never  Vaping Use   Vaping status: Never Used  Substance Use Topics   Alcohol use: Not Currently   Drug use: Never  [2]  Allergies Allergen Reactions   Latex Itching

## 2024-08-17 LAB — D-DIMER, QUANTITATIVE: D-Dimer, Quant: 0.43 ug{FEU}/mL (ref <0.50)

## 2024-08-18 ENCOUNTER — Ambulatory Visit: Payer: Self-pay | Admitting: Family Medicine

## 2024-08-18 ENCOUNTER — Telehealth: Payer: Self-pay

## 2024-08-18 NOTE — Telephone Encounter (Signed)
 Called patient left a message FMLA forms have been filled out and are ready for pick-up

## 2024-08-20 ENCOUNTER — Ambulatory Visit: Admitting: Family Medicine

## 2025-04-22 ENCOUNTER — Ambulatory Visit: Admitting: Internal Medicine

## 2025-04-28 ENCOUNTER — Ambulatory Visit: Admitting: Internal Medicine
